# Patient Record
Sex: Female | Born: 1955 | Race: White | Hispanic: No | Marital: Single | Smoking: Never smoker
Health system: Southern US, Community
[De-identification: ages and names within clinical notes are randomized; demographics above are authoritative.]

## PROBLEM LIST (undated history)

## (undated) DIAGNOSIS — M545 Low back pain, unspecified: Secondary | ICD-10-CM

## (undated) DIAGNOSIS — Z9114 Patient's other noncompliance with medication regimen: Secondary | ICD-10-CM

## (undated) DIAGNOSIS — R609 Edema, unspecified: Secondary | ICD-10-CM

## (undated) DIAGNOSIS — F32A Depression, unspecified: Secondary | ICD-10-CM

## (undated) DIAGNOSIS — I10 Essential (primary) hypertension: Secondary | ICD-10-CM

## (undated) DIAGNOSIS — F329 Major depressive disorder, single episode, unspecified: Secondary | ICD-10-CM

## (undated) DIAGNOSIS — M25569 Pain in unspecified knee: Secondary | ICD-10-CM

## (undated) DIAGNOSIS — G8929 Other chronic pain: Secondary | ICD-10-CM

## (undated) DIAGNOSIS — M542 Cervicalgia: Secondary | ICD-10-CM

## (undated) HISTORY — PX: BACK SURGERY: SHX140

## (undated) HISTORY — PX: OTHER SURGICAL HISTORY: SHX169

## (undated) HISTORY — PX: SPINE SURGERY: SHX786

## (undated) HISTORY — PX: NECK SURGERY: SHX720

---

## 1998-02-11 ENCOUNTER — Other Ambulatory Visit: Admission: RE | Admit: 1998-02-11 | Discharge: 1998-02-11 | Payer: Self-pay | Admitting: Family Medicine

## 2000-12-04 ENCOUNTER — Emergency Department (HOSPITAL_COMMUNITY): Admission: EM | Admit: 2000-12-04 | Discharge: 2000-12-04 | Payer: Self-pay | Admitting: Emergency Medicine

## 2000-12-07 ENCOUNTER — Ambulatory Visit (HOSPITAL_COMMUNITY): Admission: RE | Admit: 2000-12-07 | Discharge: 2000-12-07 | Payer: Self-pay | Admitting: General Surgery

## 2004-05-16 ENCOUNTER — Ambulatory Visit: Payer: Self-pay | Admitting: Psychiatry

## 2005-07-24 ENCOUNTER — Ambulatory Visit: Payer: Self-pay | Admitting: Family Medicine

## 2005-07-25 ENCOUNTER — Ambulatory Visit: Payer: Self-pay | Admitting: *Deleted

## 2005-08-01 ENCOUNTER — Ambulatory Visit (HOSPITAL_COMMUNITY): Admission: RE | Admit: 2005-08-01 | Discharge: 2005-08-01 | Payer: Self-pay | Admitting: Family Medicine

## 2008-02-19 ENCOUNTER — Emergency Department (HOSPITAL_COMMUNITY): Admission: EM | Admit: 2008-02-19 | Discharge: 2008-02-19 | Payer: Self-pay | Admitting: Emergency Medicine

## 2008-02-23 ENCOUNTER — Emergency Department (HOSPITAL_COMMUNITY): Admission: EM | Admit: 2008-02-23 | Discharge: 2008-02-23 | Payer: Self-pay | Admitting: Emergency Medicine

## 2008-03-13 ENCOUNTER — Emergency Department (HOSPITAL_COMMUNITY): Admission: EM | Admit: 2008-03-13 | Discharge: 2008-03-13 | Payer: Self-pay | Admitting: *Deleted

## 2008-05-05 ENCOUNTER — Ambulatory Visit (HOSPITAL_COMMUNITY): Admission: RE | Admit: 2008-05-05 | Discharge: 2008-05-06 | Payer: Self-pay | Admitting: Neurosurgery

## 2008-05-05 ENCOUNTER — Ambulatory Visit (HOSPITAL_COMMUNITY): Admission: RE | Admit: 2008-05-05 | Discharge: 2008-05-05 | Payer: Self-pay | Admitting: Neurosurgery

## 2008-07-08 ENCOUNTER — Encounter: Admission: RE | Admit: 2008-07-08 | Discharge: 2008-07-08 | Payer: Self-pay | Admitting: Neurosurgery

## 2009-03-30 ENCOUNTER — Emergency Department (HOSPITAL_COMMUNITY): Admission: EM | Admit: 2009-03-30 | Discharge: 2009-03-30 | Payer: Self-pay | Admitting: Emergency Medicine

## 2009-11-18 ENCOUNTER — Ambulatory Visit (HOSPITAL_COMMUNITY): Admission: RE | Admit: 2009-11-18 | Discharge: 2009-11-18 | Payer: Self-pay | Admitting: Otolaryngology

## 2009-12-30 ENCOUNTER — Ambulatory Visit (HOSPITAL_COMMUNITY)
Admission: RE | Admit: 2009-12-30 | Discharge: 2009-12-31 | Payer: Self-pay | Source: Home / Self Care | Attending: Otolaryngology | Admitting: Otolaryngology

## 2010-02-22 ENCOUNTER — Emergency Department (HOSPITAL_COMMUNITY)
Admission: EM | Admit: 2010-02-22 | Discharge: 2010-02-23 | Disposition: A | Discharge status: Home / Self Care | Payer: 59 | Attending: Emergency Medicine | Admitting: Emergency Medicine

## 2010-02-22 DIAGNOSIS — R42 Dizziness and giddiness: Secondary | ICD-10-CM | POA: Insufficient documentation

## 2010-02-22 DIAGNOSIS — Z91199 Patient's noncompliance with other medical treatment and regimen due to unspecified reason: Secondary | ICD-10-CM | POA: Insufficient documentation

## 2010-02-22 DIAGNOSIS — E669 Obesity, unspecified: Secondary | ICD-10-CM | POA: Insufficient documentation

## 2010-02-22 DIAGNOSIS — I10 Essential (primary) hypertension: Secondary | ICD-10-CM | POA: Insufficient documentation

## 2010-02-22 DIAGNOSIS — R51 Headache: Secondary | ICD-10-CM | POA: Insufficient documentation

## 2010-02-22 DIAGNOSIS — Z760 Encounter for issue of repeat prescription: Secondary | ICD-10-CM | POA: Insufficient documentation

## 2010-02-22 DIAGNOSIS — Z9119 Patient's noncompliance with other medical treatment and regimen: Secondary | ICD-10-CM | POA: Insufficient documentation

## 2010-02-22 LAB — DIFFERENTIAL
Basophils Absolute: 0 10*3/uL (ref 0.0–0.1)
Eosinophils Absolute: 0.2 10*3/uL (ref 0.0–0.7)
Eosinophils Relative: 3 % (ref 0–5)
Lymphocytes Relative: 27 % (ref 12–46)
Monocytes Absolute: 0.5 10*3/uL (ref 0.1–1.0)

## 2010-02-22 LAB — BASIC METABOLIC PANEL
BUN: 14 mg/dL (ref 6–23)
Calcium: 9.4 mg/dL (ref 8.4–10.5)
GFR calc non Af Amer: 42 mL/min — ABNORMAL LOW (ref >60)
Glucose, Bld: 100 mg/dL — ABNORMAL HIGH (ref 70–99)

## 2010-02-22 LAB — CBC
HCT: 39.6 % (ref 36.0–46.0)
MCHC: 34.8 g/dL (ref 30.0–36.0)
MCV: 84.1 fL (ref 78.0–100.0)
Platelets: 205 10*3/uL (ref 150–400)
RDW: 13.2 % (ref 11.5–15.5)
WBC: 7.2 10*3/uL (ref 4.0–10.5)

## 2010-03-16 ENCOUNTER — Other Ambulatory Visit (HOSPITAL_COMMUNITY): Payer: Self-pay | Admitting: Physician Assistant

## 2010-03-16 DIAGNOSIS — Z1231 Encounter for screening mammogram for malignant neoplasm of breast: Secondary | ICD-10-CM

## 2010-03-25 ENCOUNTER — Ambulatory Visit (HOSPITAL_COMMUNITY)
Admission: RE | Admit: 2010-03-25 | Discharge: 2010-03-25 | Disposition: A | Discharge status: Home / Self Care | Payer: 59 | Source: Ambulatory Visit | Attending: Physician Assistant | Admitting: Physician Assistant

## 2010-03-25 DIAGNOSIS — Z1231 Encounter for screening mammogram for malignant neoplasm of breast: Secondary | ICD-10-CM | POA: Insufficient documentation

## 2010-03-29 LAB — CBC
MCV: 84.1 fL (ref 78.0–100.0)
Platelets: 182 10*3/uL (ref 150–400)
Platelets: 185 10*3/uL (ref 150–400)
RBC: 4.71 MIL/uL (ref 3.87–5.11)
RDW: 13.1 % (ref 11.5–15.5)
RDW: 13.3 % (ref 11.5–15.5)
WBC: 5.5 10*3/uL (ref 4.0–10.5)
WBC: 6.1 10*3/uL (ref 4.0–10.5)

## 2010-03-29 LAB — BASIC METABOLIC PANEL
BUN: 10 mg/dL (ref 6–23)
BUN: 20 mg/dL (ref 6–23)
Calcium: 9.2 mg/dL (ref 8.4–10.5)
Creatinine, Ser: 1.37 mg/dL — ABNORMAL HIGH (ref 0.4–1.2)
GFR calc Af Amer: 49 mL/min — ABNORMAL LOW (ref >60)
GFR calc Af Amer: 57 mL/min — ABNORMAL LOW (ref >60)
GFR calc non Af Amer: 40 mL/min — ABNORMAL LOW (ref >60)
GFR calc non Af Amer: 47 mL/min — ABNORMAL LOW (ref >60)
Potassium: 4.1 mEq/L (ref 3.5–5.1)
Sodium: 141 mEq/L (ref 135–145)

## 2010-03-29 LAB — SURGICAL PCR SCREEN
MRSA, PCR: NEGATIVE
Staphylococcus aureus: NEGATIVE
Staphylococcus aureus: NEGATIVE

## 2010-04-11 LAB — CBC
HCT: 39 % (ref 36.0–46.0)
Hemoglobin: 12.6 g/dL (ref 12.0–15.0)
MCV: 87.1 fL (ref 78.0–100.0)
Platelets: 177 10*3/uL (ref 150–400)
RDW: 12.7 % (ref 11.5–15.5)
WBC: 10.4 10*3/uL (ref 4.0–10.5)

## 2010-04-11 LAB — URINALYSIS, ROUTINE W REFLEX MICROSCOPIC
Bilirubin Urine: NEGATIVE
Glucose, UA: NEGATIVE mg/dL
Hgb urine dipstick: NEGATIVE
Protein, ur: NEGATIVE mg/dL

## 2010-04-11 LAB — COMPREHENSIVE METABOLIC PANEL
Albumin: 3.5 g/dL (ref 3.5–5.2)
Alkaline Phosphatase: 70 U/L (ref 39–117)
BUN: 5 mg/dL — ABNORMAL LOW (ref 6–23)
Chloride: 102 mEq/L (ref 96–112)
Creatinine, Ser: 1.08 mg/dL (ref 0.4–1.2)
Glucose, Bld: 126 mg/dL — ABNORMAL HIGH (ref 70–99)
Total Bilirubin: 0.9 mg/dL (ref 0.3–1.2)

## 2010-04-11 LAB — URINE MICROSCOPIC-ADD ON

## 2010-04-11 LAB — PREGNANCY, URINE: Preg Test, Ur: NEGATIVE

## 2010-04-11 LAB — URINE CULTURE: Colony Count: >=100000

## 2010-04-11 LAB — DIFFERENTIAL
Basophils Absolute: 0.1 10*3/uL (ref 0.0–0.1)
Basophils Relative: 1 % (ref 0–1)
Lymphocytes Relative: 9 % — ABNORMAL LOW (ref 12–46)
Monocytes Absolute: 0.6 10*3/uL (ref 0.1–1.0)
Neutro Abs: 8.8 10*3/uL — ABNORMAL HIGH (ref 1.7–7.7)
Neutrophils Relative %: 85 % — ABNORMAL HIGH (ref 43–77)

## 2010-04-12 ENCOUNTER — Encounter (HOSPITAL_COMMUNITY)
Admission: RE | Admit: 2010-04-12 | Discharge: 2010-04-12 | Disposition: A | Discharge status: Home / Self Care | Payer: 59 | Source: Ambulatory Visit | Attending: Otolaryngology | Admitting: Otolaryngology

## 2010-04-12 LAB — BASIC METABOLIC PANEL
CO2: 28 mEq/L (ref 19–32)
Calcium: 9.2 mg/dL (ref 8.4–10.5)
Chloride: 107 mEq/L (ref 96–112)
Creatinine, Ser: 1.13 mg/dL (ref 0.4–1.2)
Glucose, Bld: 89 mg/dL (ref 70–99)

## 2010-04-12 LAB — CBC
HCT: 39.3 % (ref 36.0–46.0)
Hemoglobin: 13.6 g/dL (ref 12.0–15.0)
MCH: 28.8 pg (ref 26.0–34.0)
MCHC: 34.6 g/dL (ref 30.0–36.0)
MCV: 83.1 fL (ref 78.0–100.0)
RBC: 4.73 MIL/uL (ref 3.87–5.11)

## 2010-04-12 LAB — SURGICAL PCR SCREEN: Staphylococcus aureus: NEGATIVE

## 2010-04-14 ENCOUNTER — Observation Stay (HOSPITAL_COMMUNITY)
Admission: RE | Admit: 2010-04-14 | Discharge: 2010-04-15 | Disposition: A | Discharge status: Home / Self Care | Payer: 59 | Source: Ambulatory Visit | Attending: Otolaryngology | Admitting: Otolaryngology

## 2010-04-14 DIAGNOSIS — I10 Essential (primary) hypertension: Secondary | ICD-10-CM | POA: Insufficient documentation

## 2010-04-14 DIAGNOSIS — Z01812 Encounter for preprocedural laboratory examination: Secondary | ICD-10-CM | POA: Insufficient documentation

## 2010-04-14 DIAGNOSIS — M129 Arthropathy, unspecified: Secondary | ICD-10-CM | POA: Insufficient documentation

## 2010-04-14 DIAGNOSIS — J3801 Paralysis of vocal cords and larynx, unilateral: Principal | ICD-10-CM | POA: Insufficient documentation

## 2010-04-14 DIAGNOSIS — E669 Obesity, unspecified: Secondary | ICD-10-CM | POA: Insufficient documentation

## 2010-04-27 LAB — CBC
HCT: 42.2 % (ref 36.0–46.0)
Hemoglobin: 14.5 g/dL (ref 12.0–15.0)
MCV: 85.2 fL (ref 78.0–100.0)
Platelets: 178 10*3/uL (ref 150–400)
RDW: 12.7 % (ref 11.5–15.5)
WBC: 5.9 10*3/uL (ref 4.0–10.5)

## 2010-04-27 LAB — BASIC METABOLIC PANEL
BUN: 23 mg/dL (ref 6–23)
Chloride: 105 mEq/L (ref 96–112)
GFR calc non Af Amer: 48 mL/min — ABNORMAL LOW (ref >60)
Glucose, Bld: 117 mg/dL — ABNORMAL HIGH (ref 70–99)
Potassium: 4.6 mEq/L (ref 3.5–5.1)
Sodium: 135 mEq/L (ref 135–145)

## 2010-04-27 LAB — DIFFERENTIAL
Eosinophils Absolute: 0.1 10*3/uL (ref 0.0–0.7)
Eosinophils Relative: 2 % (ref 0–5)
Lymphocytes Relative: 24 % (ref 12–46)
Lymphs Abs: 1.4 10*3/uL (ref 0.7–4.0)
Monocytes Absolute: 0.4 10*3/uL (ref 0.1–1.0)

## 2010-04-27 LAB — ABO/RH: ABO/RH(D): AB NEG

## 2010-05-03 LAB — POCT CARDIAC MARKERS
CKMB, poc: <1 ng/mL — ABNORMAL LOW (ref 1.0–8.0)
Troponin i, poc: <0.05 ng/mL (ref 0.00–0.09)

## 2010-05-12 NOTE — Op Note (Signed)
  NAME:  Marie Daniel, Marie Daniel NO.:  [id number]  MEDICAL RECORD NO.:  [id number]           PATIENT TYPE:  O  LOCATION:  5126                         FACILITY:  [facility]  PHYSICIAN:  Suzanna Obey, M.D.       DATE OF BIRTH:  1955-09-30  DATE OF PROCEDURE:  04/14/2010 DATE OF DISCHARGE:                              OPERATIVE REPORT   PREOPERATIVE DIAGNOSIS:  Right true vocal cord paralysis.  POSTOPERATIVE DIAGNOSIS:  Right true vocal cord paralysis.  SURGICAL PROCEDURE:  Right laryngoplasty revision.  ANESTHESIA:  MAC.  ESTIMATED BLOOD LOSS:  Less than 5 mL.  INDICATIONS:  This is a 55 year old who had a previous vocal cord paralysis from cervical surgery and had a laryngoplasty.  She still retained some breathy-type voice and wanted to have a better voice.  We discussed the options and she wanted to proceed with revision larger implant surgery.  She was informed of risk and benefits of the procedure and options were discussed.  All questions were answered and consent was obtained.  OPERATION:  The patient was taken to the operating room and placed in a supine position after local injection and sedation.  She was prepped and draped in the usual sterile manner.  An incision was made through the previous incision site, dissected down to the thyroid cartilage.  The midline was identified.  There was some scar tissue overlying the thyroid cartilage as well as the implant.  It was carefully dissected with sharp knife dissection revealing the implant and the suture that was holding in place.  It was removed.  The pocket was opened further with Irven [place] and her voice was checked.  The implant was positioned and then a larger implant with more medial displacement was fashioned and then inserted into the previous site.  She had a much stronger voice with this implant was secured with a 4-0 nylon anteriorly and the fiberoptic scope was used to examine the larynx, which had  no evidence of ecchymosis or hematoma or extrusion in the larynx area.  She was then closed with interrupted 4-0 chromic and a rubber band drain placed over the implant site.  The drain was secured with a 4-0 nylon and the skin was closed with a running 5-0 nylon.  The patient was then awakened, brought to the recovery room in a stable condition.  Counts correct.          ______________________________ Suzanna Obey, M.D.     JB/MEDQ  D:  04/14/2010  T:  04/15/2010  Job:  [id number]  Electronically Signed by Suzanna Obey M.D. on 05/12/2010 09:30:22 AM

## 2010-05-31 NOTE — Op Note (Signed)
NAME:  Marie Daniel, Marie Daniel              ACCOUNT NO.:  [id number]   MEDICAL RECORD NO.:  [id number]          PATIENT TYPE:  INP   LOCATION:  3524                         FACILITY:  [facility]   PHYSICIAN:  Henry A. Pool, M.D.    DATE OF BIRTH:  1955/08/22   DATE OF PROCEDURE:  05/05/2008  DATE OF DISCHARGE:                               OPERATIVE REPORT   PREOPERATIVE DIAGNOSES:  1. 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  2. Left L2-L3 herniated pulposus with radiculopathy.   POSTOPERATIVE DIAGNOSES:  1. 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  2. Left L2-L3 herniated pulposus with radiculopathy.   PROCEDURES:  1. C5-C6 and C6-C7 anterior cervical diskectomy and fusion with      allograft and Tether plating.  2. Left L2-L3 laminotomy and microdiskectomy, done through separate      incisions.   SURGEON:  Kathaleen Maser. Pool, MD.   ASSISTANT:  Reinaldo Meeker, MD   ANESTHESIA:  General endotracheal.   INDICATION:  Marie Daniel is a 55 year old female with history of neck  pain, paresthesia, and weakness with a cervical radiculopathy with some  elements of myelopathy.  Workup demonstrates evidence of left  paracentral disk herniation at C5-C6 and a leftward C6-C7 disk  herniation causing compression of the exiting C7 nerve root.  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  The patient was counseled as to  her options.  She decided to proceed with C5-C6 and C6-C7 anterior  cervical diskectomy, fusion, allograft, and plating.   Coincidentally, the patient has back and left lower extremity pain  consistent with left-sided L3 radiculopathy failing conservative  management.  Workup demonstrates evidence of left-sided L2-L3 laminotomy  microdiskectomy and left-sided L2-L3 disk herniation with compression of  left-sided L3 nerve root.  The patient  presents now for simultaneous  left-sided L2-L3 laminotomy and microdiskectomy.   OPERATIVE NOTE:  The patient was brought to the operating room, placed  on the operative table in supine position.  After adequate level of  anesthesia was achieved, the patient was positioned supine with neck  slightly extended and held in place with halter traction.  The patient's  anterior cervical region was prepped and draped sterilely. A 10 blade  was used to make a linear skin incision overlying the C6 vertebral  level.  This was carried down sharply down to the platysma.  Platysma  was then divided vertically and dissection proceeded on the medial  border of sternocleidomastoid muscle and carotid sheath.  Trachea and  esophagus were mobilized and tracked towards the left.  Prevertebral  fascia was stripped off the spinal column.  Longus colli muscle was  elevated and retracted towards the left.  Deep self-retaining retractor  was placed.  Intraoperative fluoroscopy was used and the C5-C6 and C6-C7  levels were confirmed.  Disk space at both levels were then incised with  15 blade in a rectangular fashion.  Wide disk space clean-out was  achieved using pituitary rongeurs, forward and backward Karlin curettes,  Kerrison rongeurs, and high-speed drill.  All elements of the disk were  removed down to the level of the posterior annulus starting first at C5-  C6.  Microscope was then brought into the field and used throughout the  remainder of diskectomy.  The remaining aspects of annulus and  osteophytes were removed down to the level of the posterior longitudinal  ligament.  Posterior longitudinal ligament was then elevated and  resected in piecemeal fashion using Kerrison rongeurs.  The underlying  thecal sac was then identified.  Wide central decompression was  performed undercutting the bodies of C5 and C6.  Decompression was then  proceeded out to each neural foramen.  Widened anterior  foraminotomies  were then performed bilaterally.  At this point, a very thorough  decompression was achieved.  There was no injury to thecal sac or nerve  roots.  The procedure was then repeated at C6-C7 again without  complication.  Wound was then irrigated with antibiotic solution.  A 7-  mm Cornerstone allograft wedge was then packed into place and recessed  approximately 1 mm from the anterior cortical margin.  A 45-mm Atlantis  anterior cervical plate was then placed over the C5, C6, and C7 levels.  This was then attached under fluoroscopic guidance using 13-mm variable  angle screws.  All 6 screws given final tightening, found to be solid  bone.  Locking screws were engaged at all 3 levels.  Final images  revealed good position of the bone grafts, hardware at proper operative  level with normal alignment of spine.  The wound was then irrigated one  final time.  Hemostasis ensured with bipolar electrocautery.  The wound  was then closed in typical fashion.  Steri-Strips and sterile dressings  were applied.  The patient remains in the operating room where she is  now repositioned into the prone position onto a Wilson frame and  appropriately padded.  The patient's lumbar regions were prepped and  draped sterilely.  A 10-blade was used to make a curvilinear skin  incision overlying the L2-L3 interspace where this was carried down  sharply in the midline.  A subperiosteal dissection was performed to the  lamina and facet joints of L2 and L3 on the left side.  Deep self-  retaining retractor was placed.  Intraoperative x-ray was taken and  level was confirmed.  Laminotomy was then performed using high-speed  drill and Kerrison rongeurs to remove the inferior aspects of the lamina  of L2, medial aspect of the L2-L3 facet joint, and the superior rim of  the L3 lamina.  Ligamentum flavum was then elevated and resected in  piecemeal fashion using Kerrison rongeurs.  The underlying thecal  sac  and exiting L3 nerve root were identified.  Microscope was then brought  into the field and used for microdissection.  The thecal sac and L3  nerve root were gently mobilized and retracted towards midline.  Disk  herniation was readily apparent.  This was then incised with 15 blade in  a rectangular fashion.  A wide disk space cleanout was achieved using  pituitary rongeurs, upbiting pituitary rongeurs, and Epstein curettes.  All elements of the disk herniation were completely resected.  All loose  or obviously degenerative disk material was then removed from the  interspace.  At this point, a very thorough diskectomy had been  achieved.  There was no injury to thecal sac or nerve roots.  The  wound  was then irrigated with antibiotic solution.  Gelfoam was placed  topically for hemostasis.  The wound was then closed in layers with  Vicryl suture.  Steri-Strips and sterile dressing were applied.  There  were no complications.  The patient awoke and she returned to recovery  room postoperatively.           ______________________________  Kathaleen Maser Pool, M.D.     HAP/MEDQ  D:  05/05/2008  T:  05/06/2008  Job:  [id number]

## 2010-06-03 NOTE — H&P (Signed)
[facility]  Patient:    Marie Daniel, Marie Daniel Visit Number: [id number] MRN: [id number]          Service Type: DSU Location: DAY Attending Physician:  Dessa Phi Dictated by:   Elpidio Anis, M.D. Admit Date:  12/07/2000                           History and Physical  HISTORY OF PRESENT ILLNESS:  A 55 year old female with a painful nodule over the posterior neck with gradual increase in size over the past few months.  It has become more painful and needs excision.  PAST MEDICAL HISTORY:  She has hypertension which has been present for at least 15 years.  She has been extremely noncompliant and her hypertension is poorly controlled.  She was on no chronic medications but was started on ______ 40 mg q.d. and Norvasc 5 mg q.d. on November 20.  She has had no previous surgeries.  FAMILY HISTORY:  Positive for hypertension and heart disease.  REVIEW OF SYSTEMS:  Positive for chronic leg and foot edema, otherwise negative.  PHYSICAL EXAMINATION:  VITAL SIGNS:  Blood pressure 168/100, pulse 72, respirations 20, weight 370 pounds, height 5 feet, 8 inches.  HEENT:  Unremarkable.  NECK:  A large firm tender mass of the posterior midline in the lower aspect of the neck with some partial fixation to the fascia.  There is no adenopathy.  CHEST:  Clear to auscultation.  HEART:  Regular rate and rhythm without murmur, gallop, or rub.  ABDOMEN:  Obese, soft, and nontender.  No masses.  EXTREMITIES:  2+ leg and foot edema, otherwise normal.  NEUROLOGIC:  No focal motor, sensory, or cerebellar deficits.  IMPRESSION: 1. Posterior neck mass. 2. Hypertension, untreated. 3. Peripheral edema.  PLAN:  Patient will have excision of the mass under anesthesia. Dictated by:   Elpidio Anis, M.D. Attending Physician:  Dessa Phi DD:  12/06/00 TD:  12/06/00 Job: [id number]

## 2010-06-03 NOTE — Op Note (Signed)
[facility]  Patient:    Marie Daniel, Marie Daniel Visit Number: [id number] MRN: [id number]          Service Type: DSU Location: DAY Attending Physician:  Dessa Phi Dictated by:   Elpidio Anis, M.D. Admit Date:  12/07/2000                             Operative Report  PREOPERATIVE DIAGNOSIS:  Mass of posterior neck, 4 cm.  POSTOPERATIVE DIAGNOSIS:  Deep subfascial mass of posterior neck, 4 cm.  PROCEDURE:  Wide excision of a 4-cm mass of posterior neck, deep subfascial.  SURGEON:  Elpidio Anis, M.D.  DESCRIPTION OF PROCEDURE:  Under general anesthesia, the patient was turned into the left lateral decubitus position.  Posterior neck was prepped and draped in a sterile field.  A wide elliptical incision was made around the mass.  The area of excision measured 7 x 5 cm.  The incision was extended in through the deep subcutaneous tissue down to the fascia.  The fascia was incised.  A 4-cm very hard fixed lesion was encountered.  It was excised with some surrounding normal-appearing tissue.  Deep subcutaneous tissue was closed with 2-0 Biosyn.  Subcutaneous fat was closed with 2-0 Biosyn.  Superficial layer was closed with 3-0 Biosyn.  Skin was closed with subcuticular 4-0 Dexon.  A dressing was placed.  The patient was transferred to a stretcher and then extubated without difficulty.  She was transferred to the postanesthetic care unit without difficulty.  She tolerated the procedure well. Dictated by:   Elpidio Anis, M.D. Attending Physician:  Dessa Phi DD:  12/07/00 TD:  12/08/00 Job: [id number]

## 2013-05-28 ENCOUNTER — Emergency Department (HOSPITAL_COMMUNITY): Payer: No Typology Code available for payment source

## 2013-05-28 ENCOUNTER — Other Ambulatory Visit: Payer: Self-pay

## 2013-05-28 ENCOUNTER — Emergency Department (HOSPITAL_COMMUNITY)
Admission: EM | Admit: 2013-05-28 | Discharge: 2013-05-28 | Disposition: A | Discharge status: Home / Self Care | Payer: No Typology Code available for payment source | Attending: Emergency Medicine | Admitting: Emergency Medicine

## 2013-05-28 ENCOUNTER — Encounter (HOSPITAL_COMMUNITY): Payer: Self-pay | Admitting: Emergency Medicine

## 2013-05-28 DIAGNOSIS — F3289 Other specified depressive episodes: Secondary | ICD-10-CM | POA: Insufficient documentation

## 2013-05-28 DIAGNOSIS — J40 Bronchitis, not specified as acute or chronic: Secondary | ICD-10-CM

## 2013-05-28 DIAGNOSIS — I10 Essential (primary) hypertension: Secondary | ICD-10-CM | POA: Insufficient documentation

## 2013-05-28 DIAGNOSIS — F329 Major depressive disorder, single episode, unspecified: Secondary | ICD-10-CM | POA: Insufficient documentation

## 2013-05-28 DIAGNOSIS — Z79899 Other long term (current) drug therapy: Secondary | ICD-10-CM | POA: Insufficient documentation

## 2013-05-28 DIAGNOSIS — J209 Acute bronchitis, unspecified: Secondary | ICD-10-CM | POA: Insufficient documentation

## 2013-05-28 HISTORY — DX: Essential (primary) hypertension: I10

## 2013-05-28 HISTORY — DX: Major depressive disorder, single episode, unspecified: F32.9

## 2013-05-28 HISTORY — DX: Depression, unspecified: F32.A

## 2013-05-28 LAB — I-STAT CHEM 8, ED
BUN: 19 mg/dL (ref 6–23)
CHLORIDE: 103 meq/L (ref 96–112)
CREATININE: 1.2 mg/dL — AB (ref 0.50–1.10)
Calcium, Ion: 1.25 mmol/L — ABNORMAL HIGH (ref 1.12–1.23)
GLUCOSE: 90 mg/dL (ref 70–99)
HEMATOCRIT: 42 % (ref 36.0–46.0)
Hemoglobin: 14.3 g/dL (ref 12.0–15.0)
POTASSIUM: 4 meq/L (ref 3.7–5.3)
SODIUM: 141 meq/L (ref 137–147)
TCO2: 28 mmol/L (ref 0–100)

## 2013-05-28 LAB — BASIC METABOLIC PANEL
BUN: 19 mg/dL (ref 6–23)
CHLORIDE: 104 meq/L (ref 96–112)
CO2: 25 meq/L (ref 19–32)
Calcium: 9.9 mg/dL (ref 8.4–10.5)
Creatinine, Ser: 1.15 mg/dL — ABNORMAL HIGH (ref 0.50–1.10)
GFR calc Af Amer: 60 mL/min — ABNORMAL LOW (ref >90)
GFR calc non Af Amer: 52 mL/min — ABNORMAL LOW (ref >90)
GLUCOSE: 96 mg/dL (ref 70–99)
POTASSIUM: 4.5 meq/L (ref 3.7–5.3)
Sodium: 143 mEq/L (ref 137–147)

## 2013-05-28 LAB — CBC
HEMATOCRIT: 40.5 % (ref 36.0–46.0)
HEMOGLOBIN: 13.7 g/dL (ref 12.0–15.0)
MCH: 28.4 pg (ref 26.0–34.0)
MCHC: 33.8 g/dL (ref 30.0–36.0)
MCV: 83.9 fL (ref 78.0–100.0)
Platelets: 195 10*3/uL (ref 150–400)
RBC: 4.83 MIL/uL (ref 3.87–5.11)
RDW: 12.9 % (ref 11.5–15.5)
WBC: 4.8 10*3/uL (ref 4.0–10.5)

## 2013-05-28 LAB — I-STAT TROPONIN, ED: Troponin i, poc: 0.01 ng/mL (ref 0.00–0.08)

## 2013-05-28 LAB — RAPID STREP SCREEN (MED CTR MEBANE ONLY): Streptococcus, Group A Screen (Direct): NEGATIVE

## 2013-05-28 MED ORDER — AZITHROMYCIN 250 MG PO TABS: ORAL_TABLET | ORAL | Status: DC

## 2013-05-28 MED ORDER — LISINOPRIL 10 MG PO TABS
10.0000 mg | ORAL_TABLET | Freq: Once | ORAL | Status: AC
  Administered 2013-05-28: 10 mg via ORAL
  Filled 2013-05-28: qty 1

## 2013-05-28 MED ORDER — LISINOPRIL 10 MG PO TABS: 10.0000 mg | ORAL_TABLET | Freq: Once | ORAL | Status: DC

## 2013-05-28 MED ORDER — AZITHROMYCIN 250 MG PO TABS
500.0000 mg | ORAL_TABLET | Freq: Once | ORAL | Status: AC
  Administered 2013-05-28: 500 mg via ORAL
  Filled 2013-05-28: qty 2

## 2013-05-28 NOTE — ED Notes (Addendum)
Pt reports sore throat for several days and had blood in her phleghm. States her chest hurts and she feels congested. States her throat pain is going into her ears now that she has been here. BP high; states she cannot afford BP meds.

## 2013-05-28 NOTE — Discharge Instructions (Signed)
Bronchitis Bronchitis is inflammation of the airways that extend from the windpipe into the lungs (bronchi). The inflammation often causes mucus to develop, which leads to a cough. If the inflammation becomes severe, it may cause shortness of breath. CAUSES  Bronchitis may be caused by:   Viral infections.   Bacteria.   Cigarette smoke.   Allergens, pollutants, and other irritants.  SIGNS AND SYMPTOMS  The most common symptom of bronchitis is a frequent cough that produces mucus. Other symptoms include:  Fever.   Body aches.   Chest congestion.   Chills.   Shortness of breath.   Sore throat.  DIAGNOSIS  Bronchitis is usually diagnosed through a medical history and physical exam. Tests, such as chest X-rays, are sometimes done to rule out other conditions.  TREATMENT  You may need to avoid contact with whatever caused the problem (smoking, for example). Medicines are sometimes needed. These may include:  Antibiotics. These may be prescribed if the condition is caused by bacteria.  Cough suppressants. These may be prescribed for relief of cough symptoms.   Inhaled medicines. These may be prescribed to help open your airways and make it easier for you to breathe.   Steroid medicines. These may be prescribed for those with recurrent (chronic) bronchitis. HOME CARE INSTRUCTIONS  Get plenty of rest.   Drink enough fluids to keep your urine clear or pale yellow (unless you have a medical condition that requires fluid restriction). Increasing fluids may help thin your secretions and will prevent dehydration.   Only take over-the-counter or prescription medicines as directed by your health care provider.  Only take antibiotics as directed. Make sure you finish them even if you start to feel better.  Avoid secondhand smoke, irritating chemicals, and strong fumes. These will make bronchitis worse. If you are a smoker, quit smoking. Consider using nicotine gum or  skin patches to help control withdrawal symptoms. Quitting smoking will help your lungs heal faster.   Put a cool-mist humidifier in your bedroom at night to moisten the air. This may help loosen mucus. Change the water in the humidifier daily. You can also run the hot water in your shower and sit in the bathroom with the door closed for 5 10 minutes.   Follow up with your health care provider as directed.   Wash your hands frequently to avoid catching bronchitis again or spreading an infection to others.  SEEK MEDICAL CARE IF: Your symptoms do not improve after 1 week of treatment.  SEEK IMMEDIATE MEDICAL CARE IF:  Your fever increases.  You have chills.   You have chest pain.   You have worsening shortness of breath.   You have bloody sputum.  You faint.  You have lightheadedness.  You have a severe headache.   You vomit repeatedly. MAKE SURE YOU:   Understand these instructions.  Will watch your condition.  Will get help right away if you are not doing well or get worse. Document Released: 01/02/2005 Document Revised: 10/23/2012 Document Reviewed: 08/27/2012 [facility] HospitalExitCare Patient Information 2014 [place]ExitCare, [place].  Hypertension As your heart beats, it forces blood through your arteries. This force is your blood pressure. If the pressure is too high, it is called hypertension (HTN) or high blood pressure. HTN is dangerous because you may have it and not know it. High blood pressure may mean that your heart has to work harder to pump blood. Your arteries may be narrow or stiff. The extra work puts you at risk for heart disease,  stroke, and other problems.  Blood pressure consists of two numbers, a higher number over a lower, 110/72, for example. It is stated as "110 over 72." The ideal is below 120 for the top number (systolic) and under 80 for the bottom (diastolic). Write down your blood pressure today. You should pay close attention to your blood pressure if you have  certain conditions such as:  Heart failure.  Prior heart attack.  Diabetes  Chronic kidney disease.  Prior stroke.  Multiple risk factors for heart disease. To see if you have HTN, your blood pressure should be measured while you are seated with your arm held at the level of the heart. It should be measured at least twice. A one-time elevated blood pressure reading (especially in the Emergency Department) does not mean that you need treatment. There may be conditions in which the blood pressure is different between your right and left arms. It is important to see your caregiver soon for a recheck. Most people have essential hypertension which means that there is not a specific cause. This type of high blood pressure may be lowered by changing lifestyle factors such as:  Stress.  Smoking.  Lack of exercise.  Excessive weight.  Drug/tobacco/alcohol use.  Eating less salt. Most people do not have symptoms from high blood pressure until it has caused damage to the body. Effective treatment can often prevent, delay or reduce that damage. TREATMENT  When a cause has been identified, treatment for high blood pressure is directed at the cause. There are a large number of medications to treat HTN. These fall into several categories, and your caregiver will help you select the medicines that are best for you. Medications may have side effects. You should review side effects with your caregiver. If your blood pressure stays high after you have made lifestyle changes or started on medicines,   Your medication(s) may need to be changed.  Other problems may need to be addressed.  Be certain you understand your prescriptions, and know how and when to take your medicine.  Be sure to follow up with your caregiver within the time frame advised (usually within two weeks) to have your blood pressure rechecked and to review your medications.  If you are taking more than one medicine to lower your  blood pressure, make sure you know how and at what times they should be taken. Taking two medicines at the same time can result in blood pressure that is too low. SEEK IMMEDIATE MEDICAL CARE IF:  You develop a severe headache, blurred or changing vision, or confusion.  You have unusual weakness or numbness, or a faint feeling.  You have severe chest or abdominal pain, vomiting, or breathing problems. MAKE SURE YOU:   Understand these instructions.  Will watch your condition.  Will get help right away if you are not doing well or get worse. Document Released: 01/02/2005 Document Revised: 03/27/2011 Document Reviewed: 08/23/2007 [facility] ServiceExitCare Patient Information 2014 [place], [place].

## 2013-05-28 NOTE — ED Provider Notes (Signed)
CSN: [id number]     Arrival date & time 05/28/13  1351 History   First MD Initiated Contact with Patient 05/28/13 1725     Chief Complaint  Patient presents with  . Sore Throat  . Chest Pain     HPI Pt reports sore throat for several days and had blood in her phleghm. States her chest hurts and she feels congested. States her throat pain is going into her ears now that she has been here. BP high; states she cannot afford BP meds  Past Medical History  Diagnosis Date  . Hypertension   . Depression    History reviewed. No pertinent past surgical history. History reviewed. No pertinent family history. History  Substance Use Topics  . Smoking status: Not on file  . Smokeless tobacco: Not on file  . Alcohol Use: Not on file   OB History   Grav Para Term Preterm Abortions TAB SAB Ect Mult Living                 Review of Systems  All other systems reviewed and are negative  Allergies  Review of patient's allergies indicates no known allergies.  Home Medications   Prior to Admission medications   Medication Sig Start Date End Date Taking? Authorizing Provider  FLUoxetine (PROZAC) 20 MG capsule Take 20 mg by mouth daily.   Yes Historical Provider, MD  naproxen sodium (ANAPROX) 220 MG tablet Take 220 mg by mouth 2 (two) times daily as needed (for pain).   Yes Historical Provider, MD  azithromycin (ZITHROMAX) 250 MG tablet Take 1 tablet daily until gone 05/28/13   Nelia Shiobert L Nillie Bartolotta, MD  lisinopril (PRINIVIL,ZESTRIL) 10 MG tablet Take 1 tablet (10 mg total) by mouth once. 05/28/13   Nelia Shiobert L Melbourne Jakubiak, MD   BP 209/99  Pulse 66  Temp(Src) 98.5 F (36.9 C) (Oral)  Resp 13  SpO2 100% Physical Exam  Nursing note and vitals reviewed. Constitutional: She is oriented to person, place, and time. She appears well-developed and well-nourished. No distress.  HENT:  Head: Normocephalic and atraumatic.  Mouth/Throat: Uvula is midline and mucous membranes are normal. Posterior oropharyngeal  erythema present. No oropharyngeal exudate, posterior oropharyngeal edema or tonsillar abscesses.  Eyes: Pupils are equal, round, and reactive to light.  Neck: Normal range of motion.  Cardiovascular: Normal rate and intact distal pulses.   Pulmonary/Chest: No respiratory distress.  Abdominal: Normal appearance. She exhibits no distension.  Musculoskeletal: Normal range of motion.  Neurological: She is alert and oriented to person, place, and time. No cranial nerve deficit.  Skin: Skin is warm and dry. No rash noted.  Psychiatric: She has a normal mood and affect. Her behavior is normal.    ED Course  Procedures (including critical care time)  Medications  azithromycin (ZITHROMAX) tablet 500 mg (500 mg Oral Given 05/28/13 2016)  lisinopril (PRINIVIL,ZESTRIL) tablet 10 mg (10 mg Oral Given 05/28/13 2017)    Labs Review Labs Reviewed  BASIC METABOLIC PANEL - Abnormal; Notable for the following:    Creatinine, Ser 1.15 (*)    GFR calc non Af Amer 52 (*)    GFR calc Af Amer 60 (*)    All other components within normal limits  I-STAT CHEM 8, ED - Abnormal; Notable for the following:    Creatinine, Ser 1.20 (*)    Calcium, Ion 1.25 (*)    All other components within normal limits  RAPID STREP SCREEN  CULTURE, GROUP A STREP  CBC  I-STAT  TROPOININ, ED    Imaging Review  DG Chest 2 View (Final result)  Result time: 05/28/13 19:12:59    Final result by Rad Results In Interface (05/28/13 19:12:59)    Narrative:   CLINICAL DATA: Substernal chest pressure.  EXAM: CHEST 2 VIEW  COMPARISON: PA and lateral chest 12/28/2009.  FINDINGS: The lungs are clear. Heart size is normal. No pneumothorax or pleural effusion. No focal bony abnormality.  IMPRESSION: No acute disease.      EKG Interpretation   Date/Time:  Wednesday May 28 2013 14:19:52 EDT Ventricular Rate:  68 PR Interval:  128 QRS Duration: 88 QT Interval:  418 QTC Calculation: 444 R Axis:   19 Text  Interpretation:  Normal sinus rhythm with sinus arrhythmia Normal ECG  ED PHYSICIAN INTERPRETATION AVAILABLE IN [facility] Confirmed by  TEST, Record ([id number]) on 05/30/2013 10:15:20 AM      MDM   Final diagnoses:  Bronchitis  Hypertension    \    Nelia Shiobert L Alberta Lenhard, MD 06/07/13 [phone]

## 2013-05-28 NOTE — ED Notes (Signed)
RN informed MD of patient's BP

## 2013-05-30 LAB — CULTURE, GROUP A STREP

## 2013-08-24 ENCOUNTER — Encounter (HOSPITAL_COMMUNITY): Payer: Self-pay | Admitting: Emergency Medicine

## 2013-08-24 ENCOUNTER — Emergency Department (HOSPITAL_COMMUNITY): Payer: No Typology Code available for payment source

## 2013-08-24 ENCOUNTER — Emergency Department (HOSPITAL_COMMUNITY)
Admission: EM | Admit: 2013-08-24 | Discharge: 2013-08-24 | Disposition: A | Discharge status: Home / Self Care | Payer: No Typology Code available for payment source | Attending: Emergency Medicine | Admitting: Emergency Medicine

## 2013-08-24 DIAGNOSIS — M25569 Pain in unspecified knee: Secondary | ICD-10-CM | POA: Insufficient documentation

## 2013-08-24 DIAGNOSIS — Z79899 Other long term (current) drug therapy: Secondary | ICD-10-CM | POA: Insufficient documentation

## 2013-08-24 DIAGNOSIS — IMO0002 Reserved for concepts with insufficient information to code with codable children: Secondary | ICD-10-CM | POA: Insufficient documentation

## 2013-08-24 DIAGNOSIS — F3289 Other specified depressive episodes: Secondary | ICD-10-CM | POA: Insufficient documentation

## 2013-08-24 DIAGNOSIS — I158 Other secondary hypertension: Secondary | ICD-10-CM | POA: Insufficient documentation

## 2013-08-24 DIAGNOSIS — M25561 Pain in right knee: Secondary | ICD-10-CM

## 2013-08-24 DIAGNOSIS — I159 Secondary hypertension, unspecified: Secondary | ICD-10-CM

## 2013-08-24 DIAGNOSIS — M1710 Unilateral primary osteoarthritis, unspecified knee: Secondary | ICD-10-CM

## 2013-08-24 DIAGNOSIS — F329 Major depressive disorder, single episode, unspecified: Secondary | ICD-10-CM | POA: Insufficient documentation

## 2013-08-24 MED ORDER — TRAMADOL HCL 50 MG PO TABS: 50.0000 mg | ORAL_TABLET | Freq: Four times a day (QID) | ORAL | Status: DC | PRN

## 2013-08-24 MED ORDER — TRAMADOL HCL 50 MG PO TABS
50.0000 mg | ORAL_TABLET | Freq: Once | ORAL | Status: AC
  Administered 2013-08-24: 50 mg via ORAL
  Filled 2013-08-24: qty 1

## 2013-08-24 MED ORDER — LISINOPRIL 10 MG PO TABS: 10.0000 mg | ORAL_TABLET | Freq: Every day | ORAL | Status: DC

## 2013-08-24 NOTE — Progress Notes (Signed)
Orthopedic Tech Progress Note Patient Details:  Belva ChimesRobbin Lee Tyler 1955/06/26 [id number]  Ortho Devices Type of Ortho Device: Knee Immobilizer Ortho Device/Splint Location: RLE Ortho Device/Splint Interventions: Ordered;Application   Jennye MoccasinHughes, Sayla Golonka Craig 08/24/2013, 5:07 PM

## 2013-08-24 NOTE — ED Notes (Signed)
Pt c/o right knee pain. Pt was at church today in a seated position, when she stood up she was unable to put weight on her right knee. 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 Bystanders at church placed a knee immobilizer on pt.

## 2013-08-24 NOTE — ED Provider Notes (Signed)
CSN: [id number]     Arrival date & time 08/24/13  1435 History  This chart was scribed for Junius FinnerErin O'Malley, PA-C, working with Raeford RazorStephen Kohut, MD by Chestine SporeSoijett Blue, ED Scribe. The patient was seen in room TR08C/TR08C at 3:16 PM.     Chief Complaint  Patient presents with  . Knee Pain      The history is provided by the patient. No language interpreter was used.   HPI Comments: Marie Daniel is a 58 y.o. female with a medical hx of HTN who presents to the Emergency Department complaining of right knee pain onset today. She states that she was at church in a seated position and when she tried to stand up, she was unable to bear weight on her knee.  She states that her knee went out on her today. She states that towards the end of the service there was a slight pain in her knee but became severe, 9/10 when she attempted to stand. She states that bystanders at church placed a knee immobilizer on the pt.   She denies any other associated symptoms. She denies knee surgery. She states that she does not have an Orthopedist. She states that she went to one in 2011 and was informed that she did not have any cartilage in her knee.  She states that she was taking Aleve for her knee pain and was informed to take ibuprofen to alleviate the symptoms. She states that she has had a joint injection before and it helped her with her knee pain but has not seen an orthopedist for several years.  Past Medical History  Diagnosis Date  . Hypertension   . Depression    Past Surgical History  Procedure Laterality Date  . Back surgery    . Neck surgery    . Vocal cord surgery     No family history on file. History  Substance Use Topics  . Smoking status: Never Smoker   . Smokeless tobacco: Not on file  . Alcohol Use: Yes   OB History   Grav Para Term Preterm Abortions TAB SAB Ect Mult Living                 Review of Systems  Musculoskeletal: Positive for arthralgias (right knee pain).  All other  systems reviewed and are negative.     Allergies  Review of patient's allergies indicates no known allergies.  Home Medications   Prior to Admission medications   Medication Sig Start Date End Date Taking? Authorizing Provider  acetaminophen (TYLENOL) 500 MG tablet Take 1,500 mg by mouth daily as needed for mild pain.   Yes Historical Provider, MD  FLUoxetine (PROZAC) 20 MG capsule Take 20 mg by mouth daily.   Yes Historical Provider, MD  lisinopril (PRINIVIL,ZESTRIL) 10 MG tablet Take 1 tablet (10 mg total) by mouth daily. 08/24/13   Junius FinnerErin O'Malley, PA-C  traMADol (ULTRAM) 50 MG tablet Take 1 tablet (50 mg total) by mouth every 6 (six) hours as needed. 08/24/13   Junius FinnerErin O'Malley, PA-C   BP 153/103  Pulse 65  Temp(Src) 98 F (36.7 C) (Oral)  Resp 20  Ht 5\' 7"  (1.702 m)  Wt 385 lb (174.635 kg)  BMI 60.29 kg/m2  SpO2 95%  Physical Exam  Nursing note and vitals reviewed. Constitutional: She is oriented to person, place, and time. She appears well-developed and well-nourished.  Morbidly obese female sitting in wheelchair, NAD.  HENT:  Head: Normocephalic and atraumatic.  Eyes: EOM are  normal.  Neck: Normal range of motion.  Cardiovascular: Normal rate.   Pulmonary/Chest: Effort normal.  Musculoskeletal: Normal range of motion.  Exam limited due to body habitus. Right knee Tenderness along the medial joint space. Pain with knee flexion. sensation to light touch intact. Pedal pulses 2+.   Neurological: She is alert and oriented to person, place, and time.  Skin: Skin is warm and dry. No erythema.  Skin intact. No ecchymosis or erythema.   Psychiatric: She has a normal mood and affect. Her behavior is normal.    ED Course  Procedures (including critical care time) DIAGNOSTIC STUDIES: Oxygen Saturation is 95% on room air, adequate by my interpretation.    COORDINATION OF CARE: 3:20 PM-Discussed treatment plan with pt at bedside and pt agreed to plan.   Labs Review Labs Reviewed  - No data to display  Imaging Review Dg Knee Complete 4 Views Right  08/24/2013   CLINICAL DATA:  Right knee pain.  EXAM: RIGHT KNEE - COMPLETE 4+ VIEW  COMPARISON:  None.  FINDINGS: Significant tricompartmental degenerative changes with joint space narrowing, osteophytic spurring and subchondral cystic change. No acute fracture. No joint effusion.  IMPRESSION: Advanced tricompartmental degenerative changes but no acute fracture or joint effusion.   Electronically Signed   By: Loralie Champagne M.D.   On: 08/24/2013 16:26     EKG Interpretation None      MDM   Final diagnoses:  Tricompartmental disease of knee  Right knee pain  Secondary hypertension, unspecified    Pt is a morbidly obese female with hx of "no cartilage" in her right knee, c/o sudden onset right knee pain after attempting to stand after church today.  Exam limited to due body habitus, medial joint space tenderness.  Knee immobilizer from friend does help with pain.  Plain films: advanced tricompartmental degenerative changes but no acute fracture or joint effusion.  Rx: knee immobilizer, crutches and tramadol.  Advised to f/u with [facility] for PCP.  Will refill HTN medication- lisinopril as she has been out for 3 months.  Elevated BP today- 153/103.  Return precautions provided. Pt verbalized understanding and agreement with tx plan.   I personally performed the services described in this documentation, which was scribed in my presence. The recorded information has been reviewed and is accurate.    Junius Finner, PA-C 08/24/13 1646

## 2013-08-28 NOTE — ED Provider Notes (Signed)
Medical screening examination/treatment/procedure(s) were performed by non-physician practitioner and as supervising physician I was immediately available for consultation/collaboration.   EKG Interpretation None       Mayre Bury, MD 08/28/13 0628 

## 2015-10-02 ENCOUNTER — Encounter (HOSPITAL_COMMUNITY): Payer: Self-pay | Admitting: Emergency Medicine

## 2015-10-02 ENCOUNTER — Emergency Department (HOSPITAL_COMMUNITY): Payer: No Typology Code available for payment source

## 2015-10-02 ENCOUNTER — Emergency Department (HOSPITAL_COMMUNITY)
Admission: EM | Admit: 2015-10-02 | Discharge: 2015-10-02 | Disposition: A | Discharge status: Home / Self Care | Payer: No Typology Code available for payment source | Attending: Emergency Medicine | Admitting: Emergency Medicine

## 2015-10-02 DIAGNOSIS — L03115 Cellulitis of right lower limb: Secondary | ICD-10-CM | POA: Insufficient documentation

## 2015-10-02 DIAGNOSIS — M79671 Pain in right foot: Secondary | ICD-10-CM

## 2015-10-02 DIAGNOSIS — I10 Essential (primary) hypertension: Secondary | ICD-10-CM | POA: Insufficient documentation

## 2015-10-02 HISTORY — DX: Low back pain, unspecified: M54.50

## 2015-10-02 HISTORY — DX: Low back pain: M54.5

## 2015-10-02 HISTORY — DX: Patient's other noncompliance with medication regimen: Z91.14

## 2015-10-02 HISTORY — DX: Other chronic pain: G89.29

## 2015-10-02 HISTORY — DX: Pain in unspecified knee: M25.569

## 2015-10-02 HISTORY — DX: Cervicalgia: M54.2

## 2015-10-02 HISTORY — DX: Edema, unspecified: R60.9

## 2015-10-02 LAB — CBC WITH DIFFERENTIAL/PLATELET
BASOS ABS: 0 10*3/uL (ref 0.0–0.1)
Basophils Relative: 0 %
Eosinophils Absolute: 0 10*3/uL (ref 0.0–0.7)
Eosinophils Relative: 1 %
HCT: 39.9 % (ref 36.0–46.0)
HEMOGLOBIN: 13.4 g/dL (ref 12.0–15.0)
LYMPHS PCT: 18 %
Lymphs Abs: 1.2 10*3/uL (ref 0.7–4.0)
MCH: 28.2 pg (ref 26.0–34.0)
MCHC: 33.6 g/dL (ref 30.0–36.0)
MCV: 83.8 fL (ref 78.0–100.0)
Monocytes Absolute: 0.5 10*3/uL (ref 0.1–1.0)
Monocytes Relative: 8 %
NEUTROS ABS: 4.9 10*3/uL (ref 1.7–7.7)
NEUTROS PCT: 73 %
PLATELETS: 158 10*3/uL (ref 150–400)
RBC: 4.76 MIL/uL (ref 3.87–5.11)
RDW: 13.1 % (ref 11.5–15.5)
WBC: 6.7 10*3/uL (ref 4.0–10.5)

## 2015-10-02 LAB — BASIC METABOLIC PANEL
ANION GAP: 9 (ref 5–15)
BUN: 14 mg/dL (ref 6–20)
CO2: 26 mmol/L (ref 22–32)
Calcium: 9.1 mg/dL (ref 8.9–10.3)
Chloride: 103 mmol/L (ref 101–111)
Creatinine, Ser: 1.27 mg/dL — ABNORMAL HIGH (ref 0.44–1.00)
GFR calc Af Amer: 53 mL/min — ABNORMAL LOW (ref >60)
GFR, EST NON AFRICAN AMERICAN: 46 mL/min — AB (ref >60)
Glucose, Bld: 126 mg/dL — ABNORMAL HIGH (ref 65–99)
POTASSIUM: 3.6 mmol/L (ref 3.5–5.1)
Sodium: 138 mmol/L (ref 135–145)

## 2015-10-02 MED ORDER — HYDROCODONE-ACETAMINOPHEN 5-325 MG PO TABS: ORAL_TABLET | ORAL | 0 refills | Status: AC

## 2015-10-02 MED ORDER — CEPHALEXIN 500 MG PO CAPS: 500.0000 mg | ORAL_CAPSULE | Freq: Four times a day (QID) | ORAL | 0 refills | Status: AC

## 2015-10-02 MED ORDER — HYDROCODONE-ACETAMINOPHEN 5-325 MG PO TABS
1.0000 | ORAL_TABLET | Freq: Once | ORAL | Status: AC
  Administered 2015-10-02: 1 via ORAL
  Filled 2015-10-02: qty 1

## 2015-10-02 NOTE — ED Notes (Signed)
Bed: [id number] Expected date: 10/02/15 Expected time: 9:29 AM Means of arrival: Ambulance Comments: Ingrown toenail, HTN

## 2015-10-02 NOTE — ED Provider Notes (Signed)
[facility]-EMERGENCY DEPT Provider Note   CSN: [id number] Arrival date & time: 10/02/15  4098     History   Chief Complaint Chief Complaint  Patient presents with  . Foot Pain  . Hypertension    HPI Marie Daniel is a 60 y.o. female.  HPI  Pt was seen at 0940. Per EMS and pt report, c/o gradual onset and persistence of constant right foot "pain" for the past 3 days. Pt describes the pain as "sore" and located in her right dorsal foot. Pain worsens with palpation of the area and weight bearing. Pt states she "walks just a little" with her walker, "not very far." Cannot recall injury. Pt states a friend "put a dressing" over her right great toenail "because she thought it was an ingrown nail." Denies manipulating or "picking at" the nail. Denies fevers, no injury, no focal motor weakness, no tingling/numbness in extremities. Pt also states she has "been out of" her BP meds "for a while" and "knows it's high." States she has PMD appt scheduled for 10/26/15. Denies CP/SOB, no abd pain, no headache, no N/V/D, no visual changes.   Past Medical History:  Diagnosis Date  . Chronic knee pain   . Depression   . Hypertension   . Low back pain   . Neck pain   . Non compliance w medication regimen   . Peripheral edema     There are no active problems to display for this patient.   Past Surgical History:  Procedure Laterality Date  . BACK SURGERY    . NECK SURGERY    . vocal cord surgery         Home Medications    Prior to Admission medications   Not on File    Family History No family history on file.  Social History Social History  Substance Use Topics  . Smoking status: Never Smoker  . Smokeless tobacco: Never Used  . Alcohol use Yes     Allergies   Review of patient's allergies indicates no known allergies.   Review of Systems Review of Systems ROS: Statement: All systems negative except as marked or noted in the HPI; Constitutional: Negative for fever and  chills. ; ; Eyes: Negative for eye pain, redness and discharge. ; ; ENMT: Negative for ear pain, hoarseness, nasal congestion, sinus pressure and sore throat. ; ; Cardiovascular: Negative for chest pain, palpitations, diaphoresis, dyspnea and peripheral edema. ; ; Respiratory: Negative for cough, wheezing and stridor. ; ; Gastrointestinal: Negative for nausea, vomiting, diarrhea, abdominal pain, blood in stool, hematemesis, jaundice and rectal bleeding. . ; ; Genitourinary: Negative for dysuria, flank pain and hematuria. ; ; Musculoskeletal: +right foot pain. Negative for back pain and neck pain. Negative for trauma.; ; Skin: Negative for pruritus, rash, abrasions, blisters, bruising and skin lesion.; ; Neuro: Negative for headache, lightheadedness and neck stiffness. Negative for weakness, altered level of consciousness, altered mental status, extremity weakness, paresthesias, involuntary movement, seizure and syncope.      Physical Exam Updated Vital Signs BP (!) 183/106 (BP Location: Left Arm)   Pulse 94   Temp 98.3 F (36.8 C) (Oral)   Resp 18   Ht 5\' 7"  (1.702 m)   Wt (!) 375 lb (170.1 kg)   SpO2 97%   BMI 58.73 kg/m    BP 135/80 (BP Location: Left Arm)   Pulse 70   Temp 98.3 F (36.8 C) (Oral)   Resp 18   Ht 5\' 7"  (1.702 m)  Wt (!) 375 lb (170.1 kg)   SpO2 97%   BMI 58.73 kg/m    Physical Exam 0945: Physical examination:  Nursing notes reviewed; Vital signs and O2 SAT reviewed;  Constitutional: Well developed, Well nourished, Well hydrated, In no acute distress; Head:  Normocephalic, atraumatic; Eyes: EOMI, PERRL, No scleral icterus; ENMT: Mouth and pharynx normal, Mucous membranes moist; Neck: Supple, Full range of motion, No lymphadenopathy; Cardiovascular: Regular rate and rhythm, No gallop; Respiratory: Breath sounds clear & equal bilaterally, No wheezes.  Speaking full sentences with ease, Normal respiratory effort/excursion; Chest: Nontender, Movement normal; Abdomen:  Soft, Nontender, Nondistended, Normal bowel sounds; Genitourinary: No CVA tenderness; Extremities: Pulses normal, +dorsal right foot tenderness to palp, mild erythema, no ecchymosis, no open wounds, no soft tissue crepitus. Right toenails thickened, without erythema. NT right knee/calf/ankle/toes. Muscles compartments soft.  1+ pedal edema bilat without calf asymmetry.; Neuro: AA&Ox3, Major CN grossly intact.  Speech clear. No gross focal motor or sensory deficits in extremities.; Skin: Color normal, Warm, Dry.   ED Treatments / Results  Labs (all labs ordered are listed, but only abnormal results are displayed)   EKG  EKG Interpretation None       Radiology   Procedures Procedures (including critical care time)  Medications Ordered in ED Medications  HYDROcodone-acetaminophen (NORCO/VICODIN) 5-325 MG per tablet 1 tablet (1 tablet Oral Given 10/02/15 1014)     Initial Impression / Assessment and Plan / ED Course  I have reviewed the triage vital signs and the nursing notes.  Pertinent labs & imaging results that were available during my care of the patient were reviewed by me and considered in my medical decision making (see chart for details).  MDM Reviewed: previous chart, nursing note and vitals Reviewed previous: labs Interpretation: labs and x-ray    Results for orders placed or performed during the hospital encounter of 10/02/15  CBC with Differential  Result Value Ref Range   WBC 6.7 4.0 - 10.5 K/uL   RBC 4.76 3.87 - 5.11 MIL/uL   Hemoglobin 13.4 12.0 - 15.0 g/dL   HCT 78.2 95.6 - 21.3 %   MCV 83.8 78.0 - 100.0 fL   MCH 28.2 26.0 - 34.0 pg   MCHC 33.6 30.0 - 36.0 g/dL   RDW 08.6 57.8 - 46.9 %   Platelets 158 150 - 400 K/uL   Neutrophils Relative % 73 %   Neutro Abs 4.9 1.7 - 7.7 K/uL   Lymphocytes Relative 18 %   Lymphs Abs 1.2 0.7 - 4.0 K/uL   Monocytes Relative 8 %   Monocytes Absolute 0.5 0.1 - 1.0 K/uL   Eosinophils Relative 1 %   Eosinophils  Absolute 0.0 0.0 - 0.7 K/uL   Basophils Relative 0 %   Basophils Absolute 0.0 0.0 - 0.1 K/uL  Basic metabolic panel  Result Value Ref Range   Sodium 138 135 - 145 mmol/L   Potassium 3.6 3.5 - 5.1 mmol/L   Chloride 103 101 - 111 mmol/L   CO2 26 22 - 32 mmol/L   Glucose, Bld 126 (H) 65 - 99 mg/dL   BUN 14 6 - 20 mg/dL   Creatinine, Ser 6.29 (H) 0.44 - 1.00 mg/dL   Calcium 9.1 8.9 - 52.8 mg/dL   GFR calc non Af Amer 46 (L) >60 mL/min   GFR calc Af Amer 53 (L) >60 mL/min   Anion gap 9 5 - 15   Dg Ankle Complete Right Result Date: 10/02/2015 CLINICAL DATA:  Right big  toe and lateral right ankle pain x 3 days with no injury; pt states that it suddenly started hurting and now she can't walk well due to pain; no previous injuries to the imged body parts; EXAM: RIGHT ANKLE - COMPLETE 3+ VIEW COMPARISON:  None. FINDINGS: No fracture.  No bone lesion. Ankle joint is normally spaced and aligned. No arthropathic changes. There is diffuse soft tissue edema. Small plantar calcaneal spur. IMPRESSION: 1. No fracture or ankle joint abnormality. 2. Small plantar calcaneal spur. 3. Diffuse soft tissue edema. Electronically Signed   By: Amie Portlandavid  Ormond M.D.   On: 10/02/2015 10:06   Dg Foot Complete Right Result Date: 10/02/2015 CLINICAL DATA:  Right big toe and lateral right ankle pain x 3 days with no injury; pt states that it suddenly started hurting and now she can't walk well due to pain; no previous injuries to the imged body parts; EXAM: RIGHT FOOT COMPLETE - 3+ VIEW COMPARISON:  None. FINDINGS: No fracture.  No bone lesion. Spurring noted from the dorsal medial margin of the medial cuneiform. No other degenerative/arthropathic change. Joints are normally spaced and aligned. No periarticular erosions. Mild diffuse soft tissue edema.  Small plantar calcaneal spur. IMPRESSION: 1. No fracture, bone lesion or significant joint abnormality. Specifically, no radiographic evidence of an inflammatory arthropathy.  Electronically Signed   By: Amie Portlandavid  Ormond M.D.   On: 10/02/2015 10:05   Results for Geryl CouncilmanDIPERNA, Shalae LEE (MRN [id number]) as of 10/02/2015 11:41  Ref. Range 12/28/2009 15:05 02/22/2010 20:43 04/12/2010 15:45 05/28/2013 14:29 05/28/2013 18:11 10/02/2015 10:08  BUN Latest Ref Range: 6 - 20 mg/dL 20 14 9 19 19 14   Creatinine Latest Ref Range: 0.44 - 1.00 mg/dL 4.091.37 (H) 8.111.33 (H) 9.141.13 1.15 (H) 1.20 (H) 1.27 (H)    1200:  BP improved after pain meds. BUN/Cr near baseline. WBC count reassuring. Will tx with abx, pain meds, f/u PMD. Dx and testing d/w pt.  Questions answered.  Verb understanding, agreeable to d/c home with outpt f/u.       Final Clinical Impressions(s) / ED Diagnoses   Final diagnoses:  None    New Prescriptions New Prescriptions   No medications on file     Samuel JesterKathleen Rondrick Barreira, DO 10/06/15 1646

## 2015-10-02 NOTE — Discharge Instructions (Signed)
Take the prescriptions as directed. Continue to walk with your walker and use the hard soled shoe until you are seen in follow up.  Call your regular medical doctor on Monday to schedule a follow up appointment within the next 2 to 3 days.  Return to the Emergency Department immediately sooner if worsening.

## 2015-10-02 NOTE — ED Triage Notes (Signed)
Per EMS, Pt c/o R ingrown toenail. Pt attempted to pick it out herself unsuccessfully. Pt has redness, swelling, and warmth to R foot and ankle. Pain continues to increase. Pt has hx fo HTN but doesn't take any medications for it.

## 2015-10-02 NOTE — ED Notes (Signed)
Xray at bedside. Will draw labs when complete.

## 2015-10-02 NOTE — ED Notes (Signed)
Pt denies any complaints related to hypertensions, sts "I just knew it would be high so I thought I'd let you know." Denies blurry vision, double vision, dizziness, lightheadedness, nausea.

## 2015-10-27 ENCOUNTER — Other Ambulatory Visit: Payer: Self-pay | Admitting: *Deleted

## 2015-10-27 ENCOUNTER — Encounter: Payer: Self-pay | Admitting: *Deleted

## 2015-10-27 DIAGNOSIS — I10 Essential (primary) hypertension: Secondary | ICD-10-CM | POA: Insufficient documentation

## 2015-10-27 NOTE — Patient Outreach (Signed)
[facility] Care Management  10/27/2015  Marie Daniel 1955-01-30 [id number]   RN spoke with pt today and [facility] and the purpose for today's call. Pt very receptive and RN able to completed the initial assessment and inquire further on pt's medical issues. Pt express all the areas of interest and receptive to further education on her HTN. Pt states she just paid her bill with her primary provider and now pending an appointment for Dec 21, 2015 with Dr. Sigmund HazelLisa Miller. Pt states she has had HTN for over 20 years and was on a BP medication but was without insurance for a period of time and now no longer any medication other then what was provided on the last ED visit. Pt completed anitiboitic for some cellulitis and has a couple of pain medications left if needed. RN discussed in detail HTN and the possible symptoms that maybe encountered and what to do if acute (pt with understanding).  RN offered a community home visit for a more one-on-one to address her ongoing needs more closely as pt agreed. RN offered a visit this week however pt requested a visit on Monday next week. Will scheduled a home visit and inform pt to contact this RN if needed sooner for her ongoing needs. RN will follow up accordingly and notify Dr. Hyacinth MeekerMiller of [facility] involvement once pt consented for services.  No other inquires or request at this time.  Elliot CousinLisa Matthews, RN Care Management Coordinator [facility] Main Office [phone]

## 2015-11-01 ENCOUNTER — Other Ambulatory Visit: Payer: Self-pay | Admitting: *Deleted

## 2015-11-01 NOTE — Patient Outreach (Signed)
[facility] Care Management  11/01/2015  Marie ChimesRobbin Lee Sigler [birth year] [id number]   RN spoke with pt today concerning scheduled appointment. Request for rescheduling today's appointment for later in this week Friday. RN will reschedule accordingly and completed an initial home visit. Pt continues to reports she is doing well with no issues.  Elliot CousinLisa Ladarrion Telfair, RN Care Management Coordinator [facility] Main Office [phone]

## 2015-11-05 ENCOUNTER — Encounter: Payer: Self-pay | Admitting: *Deleted

## 2015-11-05 ENCOUNTER — Other Ambulatory Visit: Payer: Self-pay | Admitting: *Deleted

## 2015-11-05 VITALS — BP 162/80 | HR 64 | Resp 20 | Ht 67.0 in | Wt 375.0 lb

## 2015-11-05 DIAGNOSIS — I10 Essential (primary) hypertension: Secondary | ICD-10-CM

## 2015-11-05 NOTE — Patient Outreach (Signed)
[facility](THN) Care Management   11/05/2015  Belva ChimesRobbin Lee Oshel [birth year] [id number]  Belva ChimesRobbin Lee Brafford is an 60 y.o. female  Subjective:  THN: Pt remains [facility]  HTN: Pt reports her history of HTN and states she has a pending appointment that will determine if she needs further intervention on medication for this medical condition. Pt states she has limited knowledge concerning HTN but receptive to the education that will be provided today by [facility] RN. Pt states she is current not taking any BP medications but aware that her BP may be elevated.   NUTRITION: Pt will to make a few changes for a healthy eating habits and try to make food choices.  MEDICAL APPOINTMENTS: Pt reports she has been attending all medical appointments using the [employer] services for transportation resource. Reports upcoming appointment with her primary provided for the initial office visit.  COMMUNITY RESOURCES: Pt requested list of resources or agencies that provide housekeeping services due to her limited income and finance hardship.  Objective:   Review of Systems  Constitutional: Negative.   HENT: Negative.   Eyes: Negative.   Respiratory: Negative.   Cardiovascular: Negative.   Gastrointestinal: Negative.   Genitourinary: Negative.   Musculoskeletal: Negative.   Skin: Negative.   Neurological: Negative.   Endo/Heme/Allergies: Negative.   Psychiatric/Behavioral: Negative.     Physical Exam  Constitutional: She is oriented to person, place, and time. She appears well-developed and well-nourished.  HENT:  Right Ear: External ear normal.  Left Ear: External ear normal.  Eyes: EOM are normal.  Neck: Normal range of motion. Neck supple.  Cardiovascular: Normal heart sounds.   Respiratory: Effort normal and breath sounds normal.  GI: Soft. Bowel sounds are normal.  Musculoskeletal: Normal range of motion. She exhibits edema.  Bilateral  edema at 4+ (lack of mobility)  Neurological: She is alert and oriented to person, place, and time.  Skin: Skin is warm and dry.  Psychiatric: She has a normal mood and affect. Her behavior is normal. Judgment and thought content normal.    Encounter Medications:   Outpatient Encounter Prescriptions as of 11/05/2015  Medication Sig Note  . HYDROcodone-acetaminophen (NORCO/VICODIN) 5-325 MG tablet 1 or 2 tabs PO q6 hours prn pain   . cephALEXin (KEFLEX) 500 MG capsule Take 1 capsule (500 mg total) by mouth 4 (four) times daily. (Patient not taking: Reported on 11/05/2015) 10/27/2015: Completed this regimen   No facility-administered encounter medications on file as of 11/05/2015.     Functional Status:   In your present state of health, do you have any difficulty performing the following activities: 10/27/2015  Hearing? N  Vision? N  Difficulty concentrating or making decisions? N  Walking or climbing stairs? Y  Dressing or bathing? Y  Doing errands, shopping? N  Preparing Food and eating ? N  Using the Toilet? N  In the past six months, have you accidently leaked urine? N  Do you have problems with loss of bowel control? Y  Managing your Medications? N  Managing your Finances? N  Housekeeping or managing your Housekeeping? N  Some recent data might be hidden    Fall/Depression Screening:    PHQ 2/9 Scores 10/27/2015  PHQ - 2 Score 0   BP (!) 162/80 (BP Location: Left Arm, Patient Position: Sitting, Cuff Size: Large)   Pulse 64   Resp 20   Ht 1.702 m (5\' 7" )   Wt (!) 375 lb (170.1  kg)   SpO2 98%   BMI 58.73 kg/m  Assessment:  Introduction the to [facility] program and services Case management services related to HTN Adherence to healthy habits related to nutritional needs Adherence to all medical appointments Community resources related to housekeeping (referral via [facility] social worker)  Plan:  Will introduced the [facility] services and obtain a signed consent for services. Will  also completed an assessment and report any abnormal symptoms.  Will discussed HTN and provided printed material via [facility] as followed: About high blood pressure, High Blood Pressure: Health Problems ad High Blood Pressure: What to do. Also provided [facility] calendar for daily documentation of BP readings if needed along with other community resources. Discussed signs and possible symptoms that can be encountered and what to do if acute symptoms should occur. Discussed the risk involved if pt does not monitor her BP regularly.  Review in detail the ASK ME THREE and purpose related to her HTN condition. Will contact Dr. Wynonia Lawman office and spoke with [facility] concerning pt's elevated BP 162/80 as pt remains asymptomatic with this BP. No orders provided at this time.  Will education on salt smart dietary measures and provide graph on high/low sodium products for a health way of living and making better choices with food items.  Will discuss and review all upcoming medical appointments and stress the importance of attending all appointments. Will offer a referral for social worker via [facility] to contact pt on the requested services needed in the home for housekeeping.  Plan of care will be discussed and goals will be review with ongoing encouraged of adherence to what has been discussed. Pt with clear understanding and agree to the goal that are in place over the course of several months with monthly review and interaction with pt participation. Will send today's report to pt's new primary care provider.  Elliot Cousin, RN Care Management Coordinator [facility] Main Office [phone]

## 2015-11-09 ENCOUNTER — Other Ambulatory Visit: Payer: Self-pay | Admitting: *Deleted

## 2015-11-09 NOTE — Patient Outreach (Signed)
[facility](THN) Care Management  11/09/2015  Marie ChimesRobbin Lee Daniel 07-11-1955 [id number]   RN followed up with pt on the information provided for housekeeping resources. Pt informed if she has no diagnosis of cancer she may not be eligible for the free  Housekeeping services. RN inquired if she has internet access for other resources that may offer housekeeping for a fee for services. Pt confirmed and will follow up accordingly for this services when needed. No other social work needs at this time as Charity fundraiserN will update Marie ColaJoanna S. Secondary school teacher(soical worker via [facility]) that no additional needs to follow and she may close her case on this pt until further notice. Pt expressed how grateful she was for the follow up on her request. Reminder pt of the next home visit for ongoing [facility] services.  Marie CousinLisa Ghislaine Harcum, RN Care Management Coordinator [facility] Main Office [phone]

## 2015-12-07 ENCOUNTER — Other Ambulatory Visit: Payer: Self-pay | Admitting: *Deleted

## 2015-12-07 NOTE — Patient Outreach (Addendum)
[facility](THN) Care Management   12/07/2015  Marie Daniel 1955/01/18 [id number]  Marie Daniel is an 60 y.o. female  Subjective:  HTN: Pt reports she is doing well with no reported issues or events. Pt denies any symptoms of HTN and states she read through all of the material provided on the last home visit with this RN case manager. Pt familiar with HTN and the possible symptoms that can occur.  Due to pt's elevated BP today discussion on seeking medical attention pt refused to go to the hospital if requested however would visit with Dr. Sigmund HazelLisa Miller if this can be arranged. MEDICAL APPOINTMENTS: Pt reports she has been attending all medical appointments with no delays using [employer] for transportation source with no encountered problems. NUTRITION: Pt reports eating healthier food items with low sodium intake with cooking more foods then eating out or take out.  Pt states she is aware of healthy eating habits but continues on occassions to have issues with paste and portion sizes. Pt is aware she needs to make better food choices.    Objective:   Review of Systems  Constitutional: Negative.   HENT: Negative.   Eyes: Negative.   Respiratory: Negative.   Cardiovascular: Negative.   Gastrointestinal: Negative.   Genitourinary: Negative.   Musculoskeletal: Negative.   Skin: Negative.   Neurological: Negative.   Endo/Heme/Allergies: Negative.   Psychiatric/Behavioral: Negative.     Physical Exam  Constitutional: She is oriented to person, place, and time. She appears well-developed and well-nourished.  HENT:  Right Ear: External ear normal.  Left Ear: External ear normal.  Eyes: EOM are normal.  Neck: Normal range of motion.  Cardiovascular: Normal heart sounds.   Respiratory: Effort normal and breath sounds normal.  GI: Soft. Bowel sounds are normal.  Musculoskeletal: Normal range of motion.  Neurological: She is alert and oriented to person, place, and  time.  Skin: Skin is warm and dry.  Psychiatric: She has a normal mood and affect. Her behavior is normal. Judgment and thought content normal.    Encounter Medications:   Outpatient Encounter Prescriptions as of 12/07/2015  Medication Sig Note  . HYDROcodone-acetaminophen (NORCO/VICODIN) 5-325 MG tablet 1 or 2 tabs PO q6 hours prn pain   . cephALEXin (KEFLEX) 500 MG capsule Take 1 capsule (500 mg total) by mouth 4 (four) times daily. (Patient not taking: Reported on 12/07/2015) 10/27/2015: Completed this regimen   No facility-administered encounter medications on file as of 12/07/2015.     Functional Status:   In your present state of health, do you have any difficulty performing the following activities: 10/27/2015  Hearing? N  Vision? N  Difficulty concentrating or making decisions? N  Walking or climbing stairs? Y  Dressing or bathing? Y  Doing errands, shopping? N  Preparing Food and eating ? N  Using the Toilet? N  In the past six months, have you accidently leaked urine? N  Do you have problems with loss of bowel control? Y  Managing your Medications? N  Managing your Finances? N  Housekeeping or managing your Housekeeping? N  Some recent data might be hidden    Fall/Depression Screening:    PHQ 2/9 Scores 10/27/2015  PHQ - 2 Score 0  BP (!) 170/108 (BP Location: Left Arm, Patient Position: Sitting, Cuff Size: Large)   Pulse 74   Resp 20   SpO2 98%   Assessment:   Ongoing case management related to HTN Follow up on adherence related to  medical appointments Follow up on adherence related to healthy eating habits.  Plan:  Will completed a physical assessment with and report any abnormal findings today. Pt continues to do well in managing her HTN. Will reiterated on the possible symptoms that can be encountered and what to do if acute. Will verify pt has reviewed the provided educational material given on last month's visit. Pt continues to increase her knowledge  base related to HTN and managing her medical problems with no reported issues.  Based upon today's assessment with elevated BP RN will contact Dr. Wynonia LawmanLisa Miller's office spoke with Tammy (office representative) and reported pt's bilateral BP along with SATs and pulse rate (pt remains asymptomatic). Will informed office to contact pt directly with interventions at this time. RN also informed pt to seek medical attention with any precipitating symptoms related to BP elevation if encountered ( pt with understanding). Also informed office that pt needs 24-48 hours notice to arrange transportation services with [employer]. Due to pt's refusing to seeking medical attention at the ED or hospital RN mentioned an urgent care center if needed.  Will verify pt continues to have sufficient transportation to and from all appointments with use of [employer] services with no delays or missed appointments.  Will discussed pt's eating habits and verify pt continues to tolerate a salt smart diet and encouraged pt to reduce her portion sizes along with healthier food choices especially over the upcoming Holidays.  Plan of care reviewed and discussed along with all goals as pt remains receptive to meeting those goals related to her HTN. Will schedule next month's home visit for ongoing case management services related to her elevated BP.   RN received a call back from Dr. Rondel BatonMiller's office and spoke with Rosalene BillingsJill Miller who indicated pt was offered an earlier appointment for Monday however declined because it would not be Dr. Sigmund HazelLisa Miller (Dr. Hyacinth MeekerMiller would not be available).  It was explained to pt by the provider office that her BP needs to be address. RN will attempt to contact pt once again tomorrow and stress the importance of addressing her elevated BP.   Elliot CousinLisa Isamar Wellbrock, RN Care Management Coordinator [facility] Main Office [phone]

## 2015-12-08 ENCOUNTER — Other Ambulatory Visit: Payer: Self-pay | Admitting: *Deleted

## 2015-12-08 NOTE — Patient Outreach (Signed)
[facility](THN) Care Management  12/08/2015  Belva ChimesRobbin Lee Dombrosky Dec 03, 1955 [id number]  RN attempted outreach call to pt to stress the importance of seeking medical attention due to her elevated BP however pt not available. RN left a HIPAA approved voice message requesting a call back. Will inquire further on pt's medical condition and again stress the risk involved if she does not seek medical attention.    Elliot CousinLisa Matthews, RN Care Management Coordinator [facility] Main Office [phone]

## 2015-12-10 ENCOUNTER — Other Ambulatory Visit: Payer: Self-pay | Admitting: *Deleted

## 2015-12-10 NOTE — Patient Outreach (Addendum)
[facility](THN) Care Management  12/10/2015  Marie ChimesRobbin Lee Daniel Mar 09, 1955 [id number]  Late Entry Due to Holiday as pt returned RN call on 11/22 at after 3pm (office was closed)  Pt left voice message responding to a call left by this RN on Wednesday. The call was concerning the importance of seeking medical attention related to her elevated BP. Pt indicated on the voice message that the only appointment that could be arranged was with a female doctor and pt indicated "I don't do female doctors".  Note pt is aware of the risk involved if her BP is not addressed by both this RN and her pending provider's office Rosalene Billings(Jill Miller). No other interventions at this time.   Elliot CousinLisa Matthews, RN Care Management Coordinator [facility] Main Office [phone]

## 2015-12-21 DIAGNOSIS — E039 Hypothyroidism, unspecified: Secondary | ICD-10-CM | POA: Diagnosis not present

## 2015-12-21 DIAGNOSIS — I10 Essential (primary) hypertension: Secondary | ICD-10-CM | POA: Diagnosis not present

## 2015-12-21 DIAGNOSIS — Z23 Encounter for immunization: Secondary | ICD-10-CM | POA: Diagnosis not present

## 2015-12-21 DIAGNOSIS — Z6841 Body Mass Index (BMI) 40.0 and over, adult: Secondary | ICD-10-CM | POA: Diagnosis not present

## 2015-12-21 DIAGNOSIS — Z1159 Encounter for screening for other viral diseases: Secondary | ICD-10-CM | POA: Diagnosis not present

## 2015-12-21 DIAGNOSIS — Z1211 Encounter for screening for malignant neoplasm of colon: Secondary | ICD-10-CM | POA: Diagnosis not present

## 2016-01-04 ENCOUNTER — Other Ambulatory Visit: Payer: Self-pay | Admitting: *Deleted

## 2016-01-04 NOTE — Patient Outreach (Signed)
[facility](THN) Care Management  01/04/2016  Belva ChimesRobbin Lee Petion 1955/03/06 [id number]  RN spoke with pt today concerning the scheduled appointment for today however pt requested to reschedule the home visit for Thursday. Will rescheduled and follow up with a home visit for this Thursday for the monthly home visit.  Elliot CousinLisa Matthews, RN Care Management Coordinator [facility] Main Office [phone]

## 2016-01-06 ENCOUNTER — Other Ambulatory Visit: Payer: Self-pay | Admitting: *Deleted

## 2016-01-06 NOTE — Patient Outreach (Signed)
[facility](THN) Care Management   01/06/2016  Marie Daniel January 07, 1956 [id number]  Marie Daniel is an 60 y.o. female  Subjective:  HTN: Pt verified she attended her pending medical office visit with Dr. Hyacinth MeekerMiller and provider has prescribed Lisinopril/HCTZ. Pt reports she recently started this medication and will follow up with her provider later in this month for recheck on her blood pressure related to this new medication. NUTRITIONAL: Pt reports she has started changing in the way she eats with a book guide called "Everyday Ketogenic Kitchen". Pt reports increase in protein and fats but reduce in carbohydrates.  DME: Pt reports she has a prescription for a rollator however not specific on the need for a heavy duty rollator. Pt requested assistance on this matter.   Objective:   Review of Systems  Constitutional: Negative.   HENT: Negative.   Eyes: Negative.   Respiratory: Negative.   Cardiovascular: Negative.   Gastrointestinal: Negative.   Genitourinary: Negative.   Musculoskeletal: Negative.   Skin: Negative.   Neurological: Negative.   Endo/Heme/Allergies: Negative.   Psychiatric/Behavioral: Negative.     Physical Exam  Constitutional: She is oriented to person, place, and time. She appears well-developed and well-nourished.  HENT:  Right Ear: External ear normal.  Left Ear: External ear normal.  Eyes: EOM are normal.  Neck: Normal range of motion.  Cardiovascular: Normal heart sounds.   Respiratory: Effort normal and breath sounds normal.  GI: Soft. Bowel sounds are normal.  Musculoskeletal: Normal range of motion.  Neurological: She is alert and oriented to person, place, and time.  Skin: Skin is warm and dry.  Psychiatric: She has a normal mood and affect. Her behavior is normal. Judgment and thought content normal.    Encounter Medications:   Outpatient Encounter Prescriptions as of 01/06/2016  Medication Sig Note  .  lisinopril-hydrochlorothiazide (PRINZIDE,ZESTORETIC) 20-25 MG tablet Take 1 tablet by mouth daily.   . cephALEXin (KEFLEX) 500 MG capsule Take 1 capsule (500 mg total) by mouth 4 (four) times daily. (Patient not taking: Reported on 01/06/2016) 10/27/2015: Completed this regimen  . HYDROcodone-acetaminophen (NORCO/VICODIN) 5-325 MG tablet 1 or 2 tabs PO q6 hours prn pain (Patient not taking: Reported on 01/06/2016)    No facility-administered encounter medications on file as of 01/06/2016.     Functional Status:   In your present state of health, do you have any difficulty performing the following activities: 10/27/2015  Hearing? N  Vision? N  Difficulty concentrating or making decisions? N  Walking or climbing stairs? Y  Dressing or bathing? Y  Doing errands, shopping? N  Preparing Food and eating ? N  Using the Toilet? N  In the past six months, have you accidently leaked urine? N  Do you have problems with loss of bowel control? Y  Managing your Medications? N  Managing your Finances? N  Housekeeping or managing your Housekeeping? N  Some recent data might be hidden    Fall/Depression Screening:    PHQ 2/9 Scores 10/27/2015  PHQ - 2 Score 0   BP (!) 152/90 (BP Location: Left Arm, Patient Position: Sitting, Cuff Size: Large)   Pulse (!) 104   Resp 20   Ht 1.702 m (5\' 7" )   Wt (!) 435 lb (197.3 kg)   SpO2 97%   BMI 68.13 kg/m  Assessment:   Ongoing case management related to HTN Education on new medication related to Lisinopril/HCTZ Follow up on nutrition habits related to healthier eating habits DME needed  related to heavy duty rollator  Plan:  Will completed a physical assessment and report any abnormal readings to her new provider. Will reiterated on the plan of care and goals in place and continue to work with pt on achieving this goal. Will inquired if pt needs additional community resources (declined at this time). Will obtain vitals today and report to Dr. Rondel BatonMiller's  office based upon the newly prescribed blood pressure medication.  Will education the new BP medications and offer additional information on the purpose of this medication. Will verify pt's adherence with this new medication with no reported issues. Will encouraged ongoing adherence and inform pt to contact this RN case manager with any issues related to affording this medication. Will verify pt's continues healthy eating habits and follows a salt smart diet. Will verified if additional information is needed to maintain her healthy eating habits. Will encourage pt to consume a healthy amount of fruits, vegetables and proteins to her diet (pt verified). Will contact [facility](Melissa Stinson) and inquired on pt obtaining a heavy duty rollator and what is needed from the provider to have this DME delivered to pt. Information obtained and provided to pt to give to her provider on the next office visit. Will encouraged pt to contact her provider with this information and request in order to obtain the rollator over the next week.  Plan of care and goals discussed with pt understanding. Will reschedule a follow up home visit next month with ongoing case management services.  Elliot CousinLisa Maura Braaten, RN Care Management Coordinator [facility] Main Office [phone]

## 2016-02-08 ENCOUNTER — Encounter: Payer: Self-pay | Admitting: *Deleted

## 2016-02-08 ENCOUNTER — Other Ambulatory Visit: Payer: Self-pay | Admitting: *Deleted

## 2016-02-08 NOTE — Patient Outreach (Signed)
[facility]) Care Management   02/08/2016  Marie Daniel 1955-10-09 [id number]  Marie Daniel is an 61 y.o. female  Subjective:  HTN: Pt reports she is doing well with no reports issues. Denies any hypo-hypertension symptoms and continues to be alert on what to do if acute signs are encountered.  MEDICATIONS: Pt reports she is running low on her BP medication and she is spacing her BP medications out to every other day. Pt reports she will pick up her regular dosage on Friday and continue to take the full dosage.  NUTRITION: Pt reports she is doing well on her diet indicating she is not has hungry as before and she has cut back on pastas and breads. States she is able to get around better and feels she has lose some weight but not sure how much. Last weight reported was 435 lbs (December).  Pt aware she has improved while involved with [facility] and receptive to being weaned from the [facility] program over the next few months.   Objective:   Review of Systems  Constitutional: Negative.   HENT: Negative.   Eyes: Negative.   Respiratory: Negative.   Cardiovascular: Negative.   Gastrointestinal: Negative.   Genitourinary: Negative.   Musculoskeletal: Negative.   Skin: Negative.   Neurological: Negative.   Endo/Heme/Allergies: Negative.   Psychiatric/Behavioral: Negative.     Physical Exam  Constitutional: She is oriented to person, place, and time. She appears well-developed and well-nourished.  HENT:  Right Ear: External ear normal.  Left Ear: External ear normal.  Neck: Normal range of motion.  Cardiovascular: Normal heart sounds.   Respiratory: Effort normal and breath sounds normal.  GI: Soft. Bowel sounds are normal.  Musculoskeletal: Normal range of motion.  Neurological: She is alert and oriented to person, place, and time.  Skin: Skin is warm and dry.  Psychiatric: She has a normal mood and affect. Her behavior is normal. Judgment and thought content  normal.    Encounter Medications:   Outpatient Encounter Prescriptions as of 02/08/2016  Medication Sig Note  . lisinopril-hydrochlorothiazide (PRINZIDE,ZESTORETIC) 20-25 MG tablet Take 1 tablet by mouth daily. Pt will obtain her full dosage 1/26 however until then pt reports she is taking every other day for supplies to last.   . cephALEXin (KEFLEX) 500 MG capsule Take 1 capsule (500 mg total) by mouth 4 (four) times daily. (Patient not taking: Reported on 02/08/2016) 10/27/2015: Completed this regimen  . HYDROcodone-acetaminophen (NORCO/VICODIN) 5-325 MG tablet 1 or 2 tabs PO q6 hours prn pain (Patient not taking: Reported on 01/06/2016)    No facility-administered encounter medications on file as of 02/08/2016.     Functional Status:   In your present state of health, do you have any difficulty performing the following activities: 10/27/2015  Hearing? N  Vision? N  Difficulty concentrating or making decisions? N  Walking or climbing stairs? Y  Dressing or bathing? Y  Doing errands, shopping? N  Preparing Food and eating ? N  Using the Toilet? N  In the past six months, have you accidently leaked urine? N  Do you have problems with loss of bowel control? Y  Managing your Medications? N  Managing your Finances? N  Housekeeping or managing your Housekeeping? N  Some recent data might be hidden    Fall/Depression Screening:    PHQ 2/9 Scores 10/27/2015  PHQ - 2 Score 0   BP 126/76 (BP Location: Left Arm, Patient Position: Sitting, Cuff Size: Large)  Pulse 75   Resp 20   Ht 1.702 m (5\' 7" )   SpO2 98%  Assessment:   Ongoing case management related to HTN Follow up on medication adherence Ongoing follow up on nutritional needs  Plan:  Will complete a physical assessment and report any findings to pt's provider. Will report update this month based upon pt's ongoing involvement.  Will confirm pt continues to do well in monitoring her BP with no reported symptoms or issues. Will  continue to enourage pt to adhere to a salt smart diet and report any encountered symptoms to her new provider.  Will strongly encouraged pt to take all her prescribed medications as recommended to avoid acute events and symptoms of HTN (pt verbalized an understanding and the risk involved). Will continue to encouraged pt on her healthy eating habits and portion sizes again discussed today. Confirmed pt her cut back on the pastas and breads to accommodate her dietary measures.  Plan of care discussed along with her goals. Will continue to encouraged ongoing adherence and follow up with pt telephonically as agreed over the next two months if pt continues to be adherent. Will update pt's provider of [facility].  Elliot CousinLisa Larayne Baxley, RN Care Management Coordinator [facility] Main Office [phone]

## 2016-03-10 ENCOUNTER — Other Ambulatory Visit: Payer: Self-pay | Admitting: *Deleted

## 2016-03-10 NOTE — Patient Outreach (Addendum)
[facility]) Care Management  03/10/2016  Belva ChimesRobbin Lee Dilworth 1955-09-17 [id number]   RN spoke with pt today and inquired on her ongoing self management of care related to her HTN. Pt reports she is doing well with no problems with her BP. States she continues to take her BP medication with a sufficient supplies on her prescribed (no needed refills) however had to reschedule an office visit with her primary provider. Pt confirmed she rescheduled this appointment. No other issues or request at this time as pt denies any signs or related symptoms of hypo-hypertension and continues to manage this condition well. Will scheduled a follow up call for March to verify pt's adherence to the rescheduled appointment prior [facility].  Elliot CousinLisa Matthews, RN Care Management Coordinator [facility] Main Office [phone]

## 2016-03-27 ENCOUNTER — Encounter: Payer: Self-pay | Admitting: *Deleted

## 2016-03-27 ENCOUNTER — Other Ambulatory Visit: Payer: Self-pay | Admitting: *Deleted

## 2016-03-27 NOTE — Patient Outreach (Signed)
[facility]) Care Management  03/27/2016  Marie Daniel Dec 30, 1955 [id number]  RN spoke with pt today and inquired on pt's ongoing self management of care related to her HTN. Pt reports her BP has been "good" with no problems. States she has a sufficient supplies of recently filled medications (90 days) and taking them accordingly.  Pt states she has improved her mobility by eliminating her hired help in the home and now doing her housing cleaning herself using a stool when needed to rest. Pt feeling much better about her progress and very grateful to the [facility] services assisting with obtaining her new primary provider, DME and overall home visits checking on her hypertension. No additional request at this time as this is the last follow up call with all goals met and pt continues to manage her care independently with no additional needs. Will plan to discharge pt at thit ime and notify her primary provider that pt has successful met all goals related to her plan of care. Case will be closed.  Raina Mina, RN Care Management Coordinator [place] Office [phone]

## 2016-08-01 DIAGNOSIS — I129 Hypertensive chronic kidney disease with stage 1 through stage 4 chronic kidney disease, or unspecified chronic kidney disease: Secondary | ICD-10-CM | POA: Diagnosis not present

## 2016-08-01 DIAGNOSIS — N189 Chronic kidney disease, unspecified: Secondary | ICD-10-CM | POA: Diagnosis not present

## 2016-08-01 DIAGNOSIS — R109 Unspecified abdominal pain: Secondary | ICD-10-CM | POA: Diagnosis not present

## 2016-08-01 DIAGNOSIS — R1013 Epigastric pain: Secondary | ICD-10-CM | POA: Diagnosis not present

## 2016-08-01 DIAGNOSIS — Z789 Other specified health status: Secondary | ICD-10-CM | POA: Diagnosis not present

## 2016-08-01 DIAGNOSIS — R079 Chest pain, unspecified: Secondary | ICD-10-CM | POA: Diagnosis not present

## 2016-08-01 DIAGNOSIS — R112 Nausea with vomiting, unspecified: Secondary | ICD-10-CM | POA: Diagnosis not present

## 2016-08-01 DIAGNOSIS — R7989 Other specified abnormal findings of blood chemistry: Secondary | ICD-10-CM | POA: Diagnosis not present

## 2018-08-03 IMAGING — DX DG FOOT COMPLETE 3+V*R*
3 series · 3 of 3 positions shown · non-contrast
Comparison: None.

CLINICAL DATA: Right big toe and lateral right ankle pain x 3 days
with no injury; pt states that it suddenly started hurting and now
she can't walk well due to pain; no previous injuries to the imged
body parts;

EXAM:
RIGHT FOOT COMPLETE - 3+ VIEW

[foot ap]
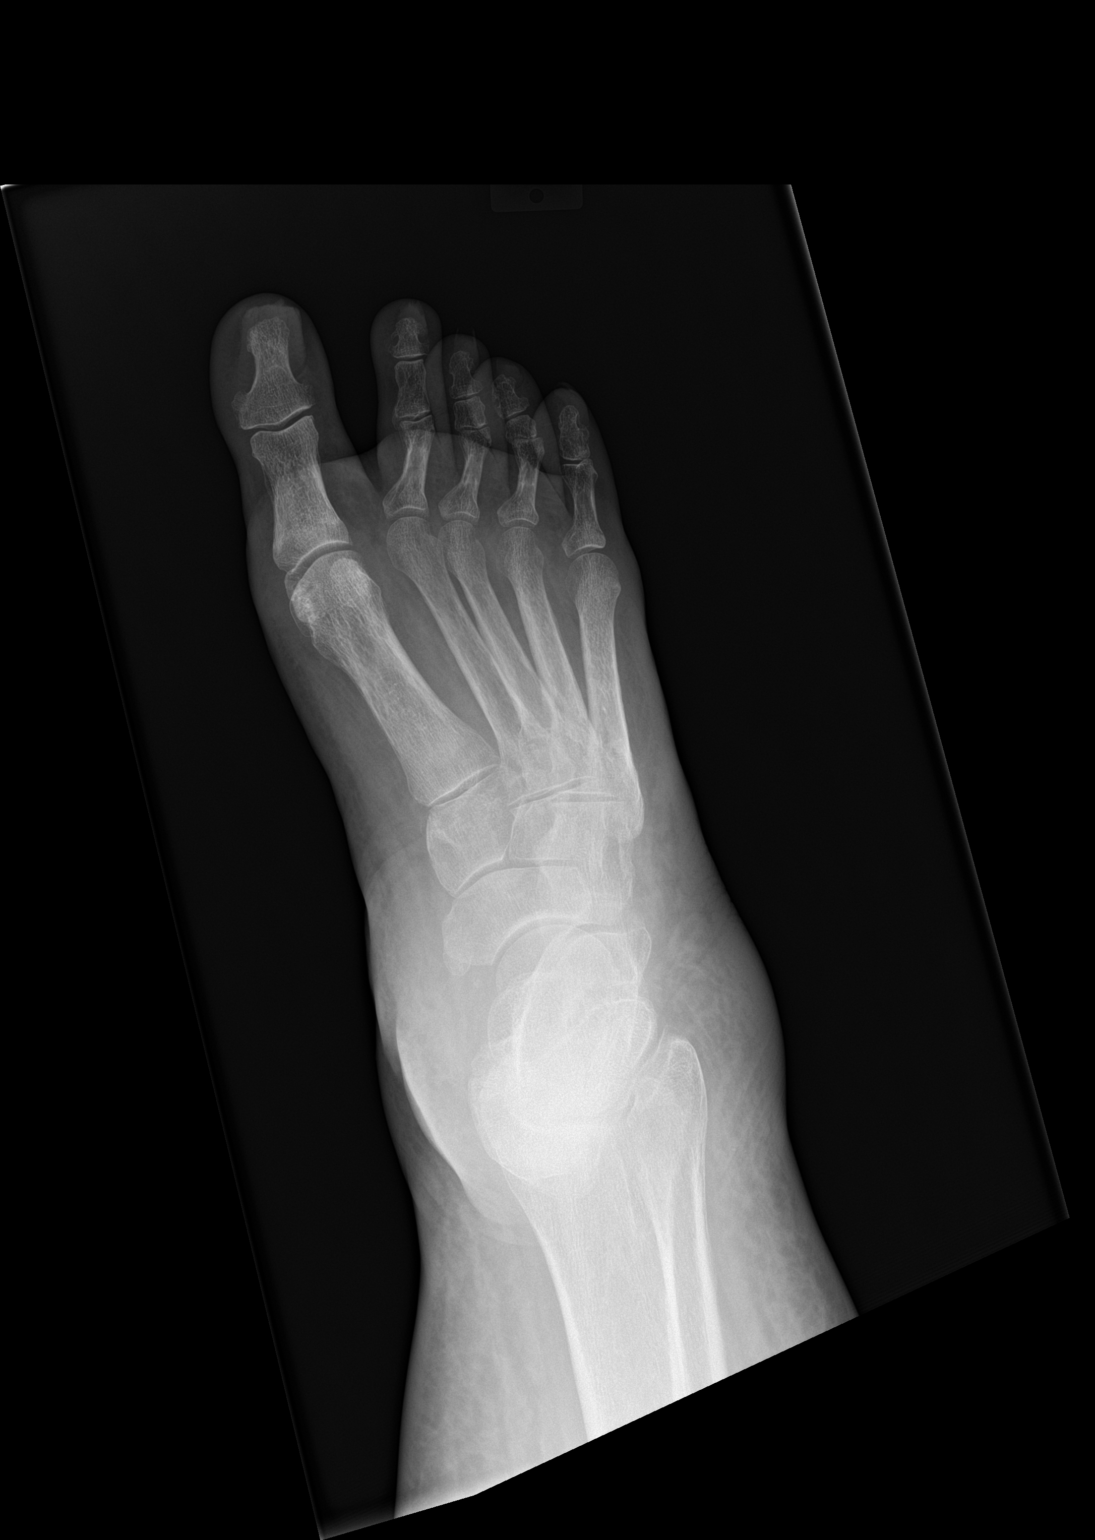

[foot obl]
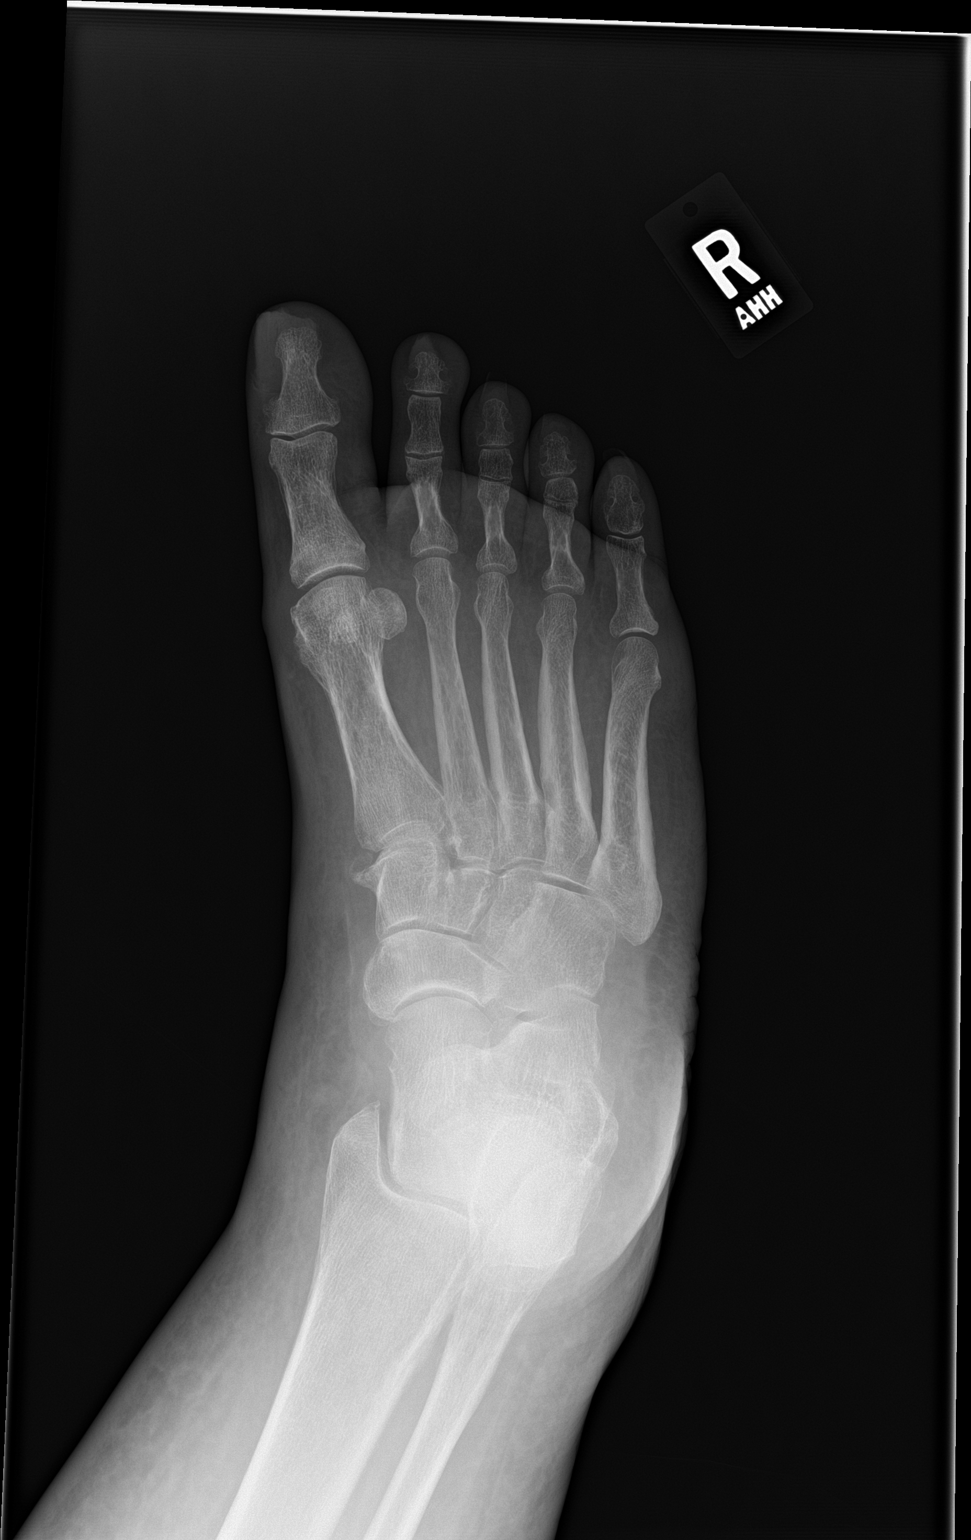

[foot lat]
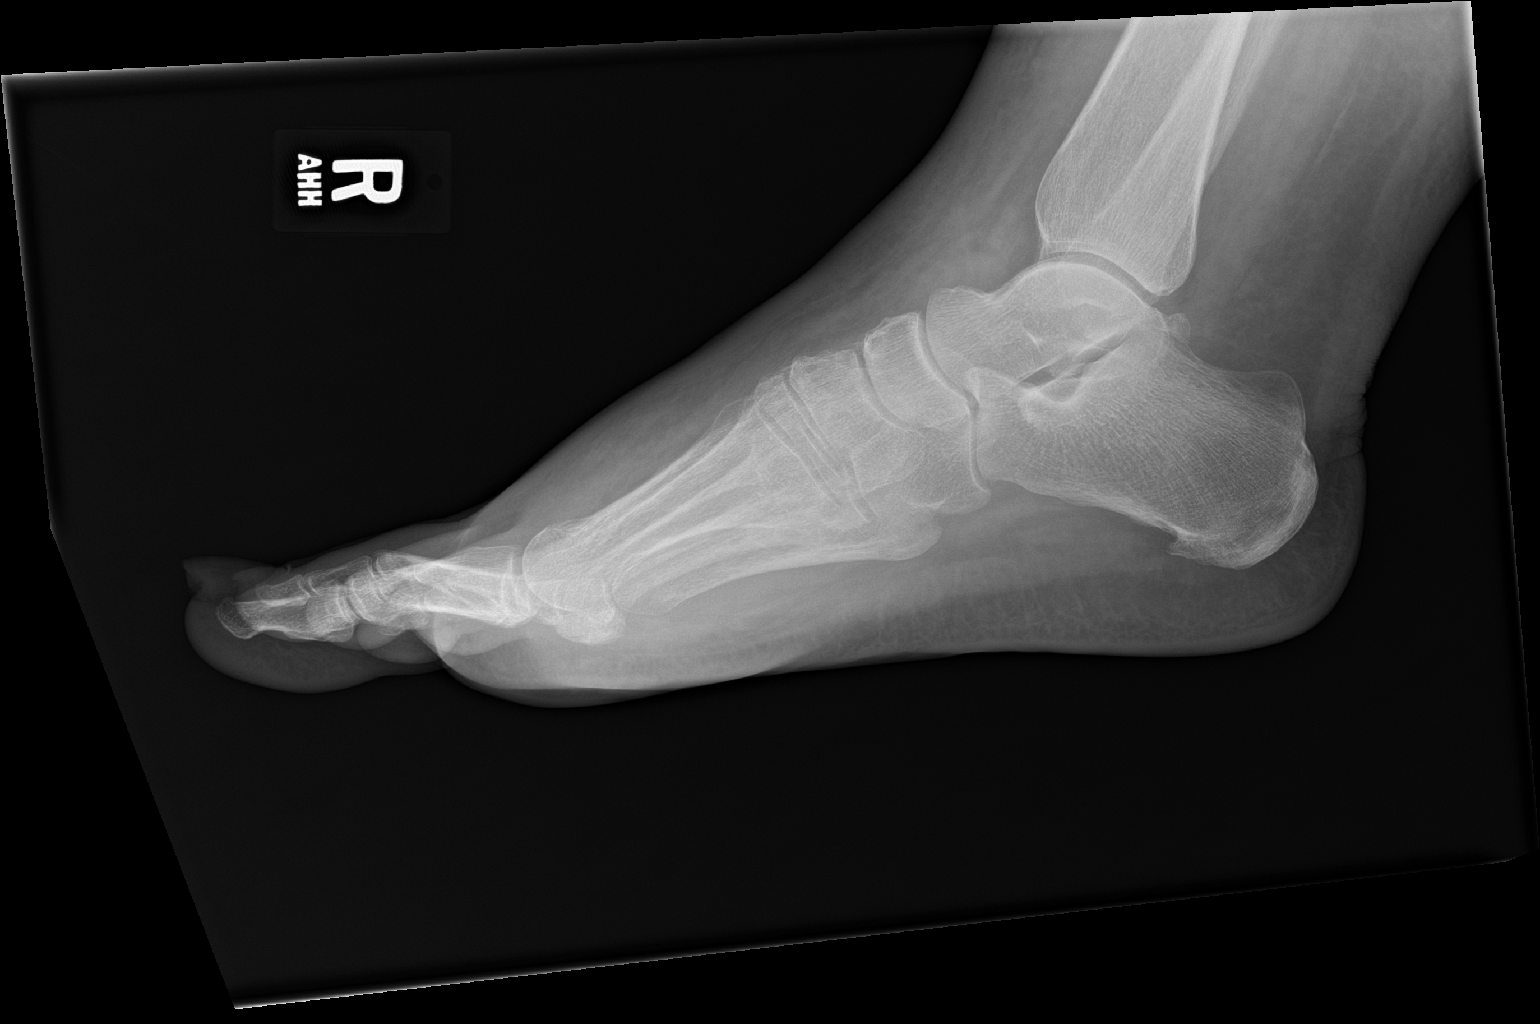

[3 of 3 positions shown; findings below may reference images not displayed]

FINDINGS: No fracture.  No bone lesion.

Spurring noted from the dorsal medial margin of the medial
cuneiform. No other degenerative/arthropathic change. Joints are
normally spaced and aligned. No periarticular erosions.

Mild diffuse soft tissue edema.  Small plantar calcaneal spur.
IMPRESSION: 1. No fracture, bone lesion or significant joint abnormality.
Specifically, no radiographic evidence of an inflammatory
arthropathy.
